# Patient Record
Sex: Male | Born: 1965
Health system: Southern US, Community
[De-identification: ages and names within clinical notes are randomized; demographics above are authoritative.]

## PROBLEM LIST (undated history)

## (undated) DIAGNOSIS — N200 Calculus of kidney: Secondary | ICD-10-CM

## (undated) DIAGNOSIS — E785 Hyperlipidemia, unspecified: Secondary | ICD-10-CM

## (undated) HISTORY — DX: Hyperlipidemia, unspecified: E78.5

## (undated) HISTORY — PX: FINGER SURGERY: SHX640

---

## 1997-11-28 ENCOUNTER — Encounter: Payer: Self-pay | Admitting: Emergency Medicine

## 1997-11-28 ENCOUNTER — Emergency Department (HOSPITAL_COMMUNITY): Admission: EM | Admit: 1997-11-28 | Discharge: 1997-11-28 | Payer: Self-pay | Admitting: Emergency Medicine

## 2000-09-04 ENCOUNTER — Ambulatory Visit (HOSPITAL_BASED_OUTPATIENT_CLINIC_OR_DEPARTMENT_OTHER): Admission: RE | Admit: 2000-09-04 | Discharge: 2000-09-04 | Payer: Self-pay | Admitting: *Deleted

## 2013-08-17 ENCOUNTER — Encounter: Payer: Self-pay | Admitting: Internal Medicine

## 2013-08-17 ENCOUNTER — Ambulatory Visit: Payer: Self-pay | Admitting: Internal Medicine

## 2013-08-17 ENCOUNTER — Ambulatory Visit (INDEPENDENT_AMBULATORY_CARE_PROVIDER_SITE_OTHER)
Admission: RE | Admit: 2013-08-17 | Discharge: 2013-08-17 | Disposition: A | Discharge status: Home / Self Care | Payer: BC Managed Care – PPO | Source: Ambulatory Visit | Attending: Internal Medicine | Admitting: Internal Medicine

## 2013-08-17 ENCOUNTER — Ambulatory Visit (INDEPENDENT_AMBULATORY_CARE_PROVIDER_SITE_OTHER): Payer: BC Managed Care – PPO | Admitting: Internal Medicine

## 2013-08-17 VITALS — BP 104/80 | HR 63 | Temp 98.2°F | Ht 72.0 in | Wt 197.8 lb

## 2013-08-17 DIAGNOSIS — R059 Cough, unspecified: Secondary | ICD-10-CM

## 2013-08-17 DIAGNOSIS — R05 Cough: Secondary | ICD-10-CM

## 2013-08-17 DIAGNOSIS — R058 Other specified cough: Secondary | ICD-10-CM

## 2013-08-17 MED ORDER — FAMOTIDINE 20 MG PO TABS: ORAL_TABLET | ORAL | Status: DC

## 2013-08-17 MED ORDER — PANTOPRAZOLE SODIUM 40 MG PO TBEC: 40.0000 mg | DELAYED_RELEASE_TABLET | Freq: Every day | ORAL | Status: DC

## 2013-08-17 MED ORDER — TRAMADOL HCL 50 MG PO TABS: ORAL_TABLET | ORAL | Status: DC

## 2013-08-17 MED ORDER — PREDNISONE 10 MG PO TABS: ORAL_TABLET | ORAL | Status: DC

## 2013-08-17 NOTE — Progress Notes (Signed)
Quick Note:  Spoke with pt and notified of results per Dr. Wert. Pt verbalized understanding and denied any questions.  ______ 

## 2013-08-17 NOTE — Patient Instructions (Signed)
The key to effective treatment for your cough is eliminating the non-stop cycle of cough you're stuck in long enough to let your airway heal completely and then see if there is anything still making you cough once you stop the cough suppression, but this should take no more than 5 days to figure out  First take delsym two tsp every 12 hours and supplement if needed with  tramadol 50 mg up to 2 every 4 hours to suppress the urge to cough at all or even clear your throat. Swallowing water or using ice chips/non mint and menthol containing candies (such as lifesavers or sugarless jolly ranchers) are also effective.  You should rest your voice and avoid activities that you know make you cough.  Once you have eliminated the cough for 3 straight days try reducing the tramadol first,  then the delsym as tolerated.    Prednisone 10 mg take  4 each am x 2 days,   2 each am x 2 days,  1 each am x 2 days and stop (this is to eliminate allergies and inflammation from coughing)  Protonix (pantoprazole) Take 30-60 min before first meal of the day and Pepcid 20 mg one bedtime plus  For drainage take chlortrimeton (chlorpheniramine) 4 mg every 4 hours available over the counter (may cause drowsiness)   GERD (REFLUX)  is an extremely common cause of respiratory symptoms, many times with no significant heartburn at all.    It can be treated with medication, but also with lifestyle changes including avoidance of late meals, excessive alcohol, smoking cessation, and avoid fatty foods, chocolate, peppermint, colas, red wine, and acidic juices such as orange juice.  NO MINT OR MENTHOL PRODUCTS SO NO COUGH DROPS  USE HARD CANDY INSTEAD (jolley ranchers or Stover's or Lifesavers (all available in sugarless versions) NO OIL BASED VITAMINS - use powdered substitutes.  Please schedule a follow up office visit in 2 weeks, sooner if needed

## 2013-08-17 NOTE — Assessment & Plan Note (Addendum)
The most common causes of chronic cough in immunocompetent adults include the following: upper airway cough syndrome (UACS), previously referred to as postnasal drip syndrome (PNDS), which is caused by variety of rhinosinus conditions; (2) asthma; (3) GERD; (4) chronic bronchitis from cigarette smoking or other inhaled environmental irritants; (5) nonasthmatic eosinophilic bronchitis; and (6) bronchiectasis.   These conditions, singly or in combination, have accounted for up to 94% of the causes of chronic cough in prospective studies.   Other conditions have constituted no >6% of the causes in prospective studies These have included bronchogenic carcinoma, chronic interstitial pneumonia, sarcoidosis, left ventricular failure, ACEI-induced cough, and aspiration from a condition associated with pharyngeal dysfunction.    Chronic cough is often simultaneously caused by more than one condition. A single cause has been found from 38 to 82% of the time, multiple causes from 18 to 62%. Multiply caused cough has been the result of three diseases up to 42% of the time.       Based on hx and exam, this is most likely:  Classic Upper airway cough syndrome, so named because it's frequently impossible to sort out how much is  CR/sinusitis with freq throat clearing (which can be related to primary GERD)   vs  causing  secondary (" extra esophageal")  GERD from wide swings in gastric pressure that occur with throat clearing, often  promoting self use of mint and menthol lozenges that reduce the lower esophageal sphincter tone and exacerbate the problem further in a cyclical fashion.   These are the same pts (now being labeled as having "irritable larynx syndrome" by some cough centers) who not infrequently have a history of having failed to tolerate ace inhibitors,  dry powder inhalers or biphosphonates or report having atypical reflux symptoms that don't respond to standard doses of PPI , and are easily confused as  having aecopd or asthma flares by even experienced allergists/ pulmonologists.   The first step is to maximize acid suppression and eliminate cyclical coughing then regroup in 2 weeks   See instructions for specific recommendations which were reviewed directly with the patient who was given a copy with highlighter outlining the key components.     

## 2013-08-17 NOTE — Progress Notes (Signed)
Subjective:    Patient ID: Dennis Murray, male    DOB: 1965/07/30   MRN: [id number]  HPI  41 yowm quit smoking 2011 with chronic cough dating back to at least 2007 referred by Dr Foy Guadalajara to pulmonary clinic for eval of incessant cough 08/17/2013    08/17/2013 1st [facility] office visit/ Sebastyan Snodgrass  Chief Complaint  Patient presents with  . Pulmonary Consult    Referred per Dr. Marinda Elk. Pt c/o cough and throat clearing for approx 8 yrs- worse for the past 2 yrs.  Cough is mainly non prod.  He has also noticed minimal DOE with walking up stairs occ.    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                            With trouble breathing? no   Do you have choking episodes when you cannot  Get enough air, gasping for air ?              Rarely    Do you usually cough when you lie down into  The bed, or when you just lie down to rest ?                          No , sleeps fine   Do you usually cough after meals or eating?         no   Do you cough when (or after) you bend over?    No    GERD SCORE     Kouffman Reflux v Neurogenic Cough Differentiator Neurogenic   Do you more-or-less cough all day long? yes   Does change of temperature make you cough? no   Does laughing or chuckling cause you to cough? yes   Do fumes (perfume, automobile fumes, burned  Toast, etc.,) cause you to cough ?      sometimes   Does speaking, singing, or talking on the phone cause you to cough   ?               yes   Neurogenic/Airway score      No obvious other patterns in day to day or daytime variabilty or assoc resting/noct sob or pleuritic/ ex cp or chest tightness, subjective wheeze overt sinus or hb symptoms. No unusual exp hx or h/o childhood pna/ asthma or knowledge of  premature birth.  Sleeping ok without nocturnal  or early am exacerbation  of respiratory  c/o's or need for noct saba. Also denies any obvious fluctuation of symptoms with weather or environmental changes or other aggravating or alleviating factors except as outlined above   Current Medications, Allergies, Complete Past Medical History, Past Surgical History, Family History, and Social History were reviewed in [employer] record.          Review of Systems  Constitutional: Negative for fever, chills, activity change, appetite change and unexpected weight change.  HENT: Positive for ear pain and trouble swallowing. Negative for congestion, dental problem, postnasal drip, rhinorrhea, sneezing, sore throat and voice change.   Eyes: Negative for visual disturbance.  Respiratory: Positive for cough and shortness of breath.  Negative for choking.   Cardiovascular: Negative for chest pain and leg swelling.  Gastrointestinal: Negative for nausea, vomiting and abdominal pain.  Genitourinary: Negative for difficulty urinating.  Musculoskeletal: Negative for arthralgias.  Skin: Negative for rash.  Psychiatric/Behavioral: Negative for behavioral problems and confusion.       Objective:   Physical Exam  amb wm with freq throat clearing  Wt Readings from Last 3 Encounters:  08/17/13 197 lb 12.8 oz (89.721 kg)      HEENT: nl dentition, turbinates, and orophanx. Nl external ear canals without cough reflex   NECK :  without JVD/Nodes/TM/ nl carotid upstrokes bilaterally   LUNGS: no acc muscle use, clear to A and P bilaterally without cough on insp or exp maneuvers   CV:  RRR  no s3 or murmur or increase in P2, no edema   ABD:  soft and nontender with nl excursion in the supine position. No bruits or organomegaly, bowel sounds nl  MS:  warm without deformities, calf tenderness, cyanosis or clubbing  SKIN: warm and dry without lesions    NEURO:  alert, approp, no  deficits     CXR  08/17/2013 :  The lungs are hyperinflated likely secondary to COPD. The heart size and mediastinal contours are within normal limits. Both lungs are clear. The visualized skeletal structures are unremarkable.      Assessment & Plan:

## 2013-08-21 ENCOUNTER — Telehealth: Payer: Self-pay | Admitting: Internal Medicine

## 2013-08-21 MED ORDER — TRAMADOL HCL 50 MG PO TABS: ORAL_TABLET | ORAL | Status: DC

## 2013-08-21 NOTE — Telephone Encounter (Signed)
Called and spoke to pt. Pt last seen on 08/17/13 by MW for upper airway cough syndrome. Pt stated he is taking the medication as directed and it has significantly decreased his cough but it has not yet resolved. Pt states he is almost out of the tramadol and wanted a refill if MW wanted him to continue taking it.   MW please advise if to refill tramadol.  No Known Allergies

## 2013-08-21 NOTE — Telephone Encounter (Signed)
Ok x one then [employer]

## 2013-08-21 NOTE — Telephone Encounter (Signed)
Rx called in. Pt has appt for 09/01/2013. Pt aware of appt and rx. Nothing further needed.

## 2013-09-01 ENCOUNTER — Ambulatory Visit (INDEPENDENT_AMBULATORY_CARE_PROVIDER_SITE_OTHER): Payer: BC Managed Care – PPO | Admitting: Internal Medicine

## 2013-09-01 ENCOUNTER — Encounter: Payer: Self-pay | Admitting: Internal Medicine

## 2013-09-01 ENCOUNTER — Ambulatory Visit: Payer: BC Managed Care – PPO | Admitting: Internal Medicine

## 2013-09-01 VITALS — BP 118/78 | HR 72 | Temp 98.5°F | Ht 72.0 in | Wt 191.4 lb

## 2013-09-01 DIAGNOSIS — R059 Cough, unspecified: Secondary | ICD-10-CM

## 2013-09-01 DIAGNOSIS — R058 Other specified cough: Secondary | ICD-10-CM

## 2013-09-01 DIAGNOSIS — R05 Cough: Secondary | ICD-10-CM

## 2013-09-01 MED ORDER — BENZONATATE 200 MG PO CAPS: ORAL_CAPSULE | ORAL | Status: DC

## 2013-09-01 NOTE — Progress Notes (Signed)
Subjective:    Patient ID: Dennis PeltonJoel Murray, male    DOB: 11/23/1965   MRN: [id number]    Brief patient profile:  48 yowm quit smoking 2011 with chronic cough dating back to at least 2007 referred by Dr Foy GuadalajaraFried to pulmonary clinic for eval of incessant cough 08/17/2013    History of Present Illness  08/17/2013 1st [facility] visit/ Dennis Murray  Chief Complaint  Patient presents with  . Pulmonary Consult    Referred per Dr. Marinda Elkobert Fried. Pt c/o cough and throat clearing for approx 8 yrs- worse for the past 2 yrs.  Cough is mainly non prod.  He has also noticed minimal DOE with walking up stairs occ.    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                            With trouble breathing? no   Do you have choking episodes when you cannot  Get enough air, gasping for air ?              Rarely    Do you usually cough when you lie down into  The bed, or when you just lie down to rest ?                          No , sleeps fine   Do you usually cough after meals or eating?         no   Do you cough when (or after) you bend over?    No    GERD SCORE     Kouffman Reflux v Neurogenic Cough Differentiator Neurogenic   Do you more-or-less cough all day long? yes   Does change of temperature make you cough? no   Does laughing or chuckling cause you to cough? yes   Do fumes (perfume, automobile fumes, burned  Toast, etc.,) cause you to cough ?      sometimes   Does speaking, singing, or talking on the phone cause you to cough   ?               yes   Neurogenic/Airway score          09/01/2013 f/u ov/Dennis Murray re: chronic cough 75% and no longer using tramadol  Chief Complaint  Patient presents with  . Follow-up    Pt states his cough has improved but never resolved. When  pt ran out of the tramadol his cough seemed to worsen again but has gotten better overall.     Not limited by breathing from desired activities    No obvious day to day or daytime variabilty or assoc excess/ purulent sputum production or cp or chest tightness, subjective wheeze overt sinus or hb symptoms. No unusual exp hx or h/o childhood pna/ asthma or knowledge of premature birth.  Sleeping ok without nocturnal  or early am exacerbation  of respiratory  c/o's or need for noct saba. Also denies any obvious fluctuation of symptoms with weather or environmental changes or other aggravating or alleviating factors except as outlined above   Current Medications, Allergies, Complete Past Medical History, Past Surgical History, Family History, and Social History  were reviewed in [facility] electronic medical record.  ROS  The following are not active complaints unless bolded sore throat, dysphagia, dental problems, itching, sneezing,  nasal congestion or excess/ purulent secretions, ear ache,   fever, chills, sweats, unintended wt loss, pleuritic or exertional cp, hemoptysis,  orthopnea pnd or leg swelling, presyncope, palpitations, heartburn, abdominal pain, anorexia, nausea, vomiting, diarrhea  or change in bowel or urinary habits, change in stools or urine, dysuria,hematuria,  rash, arthralgias, visual complaints, headache, numbness weakness or ataxia or problems with walking or coordination,  change in mood/affect or memory.           Objective:   Physical Exam  amb wm with no longer  throat clearing  . Wt Readings from Last 3 Encounters:  09/01/13 191 lb 6.4 oz (86.818 kg)  08/17/13 197 lb 12.8 oz (89.721 kg)         HEENT: nl dentition, turbinates, and orophanx. Nl external ear canals without cough reflex   NECK :  without JVD/Nodes/TM/ nl carotid upstrokes bilaterally   LUNGS: no acc muscle use, clear to A and P bilaterally without cough on insp or exp maneuvers   CV:  RRR   no s3 or murmur or increase in P2, no edema   ABD:  soft and nontender with nl excursion in the supine position. No bruits or organomegaly, bowel sounds nl  MS:  warm without deformities, calf tenderness, cyanosis or clubbing  SKIN: warm and dry without lesions    NEURO:  alert, approp, no deficits     CXR  08/17/2013 :  The lungs are hyperinflated likely secondary to COPD. The heart size and mediastinal contours are within normal limits. Both lungs are clear. The visualized skeletal structures are unremarkable.      Assessment & Plan:

## 2013-09-01 NOTE — Assessment & Plan Note (Signed)
Classic Upper airway cough syndrome, so named because it's frequently impossible to sort out how much is  CR/sinusitis with freq throat clearing (which can be related to primary GERD)   vs  causing  secondary (" extra esophageal")  GERD from wide swings in gastric pressure that occur with throat clearing, often  promoting self use of mint and menthol lozenges that reduce the lower esophageal sphincter tone and exacerbate the problem further in a cyclical fashion.   These are the same pts (now being labeled as having "irritable larynx syndrome" by some cough centers) who not infrequently have a history of having failed to tolerate ace inhibitors,  dry powder inhalers or biphosphonates or report having atypical reflux symptoms that don't respond to standard doses of PPI , and are easily confused as having aecopd or asthma flares by even experienced allergists/ pulmonologists.   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

## 2013-09-01 NOTE — Patient Instructions (Addendum)
Tessalon 200 mg every 4 hours as needed for tickle/ cough   For any sense of drainage take chlortrimeton (chlorpheniramine) 4 mg every 4 hours available over the counter (may cause drowsiness)   Stay on acid suppression x 6 wk then ok to drop off the pm dose protonix  If not satisfied the next step is adding neurontin 100 mg three times   Pulmonary follow up is as needed

## 2013-09-02 ENCOUNTER — Other Ambulatory Visit: Payer: Self-pay | Admitting: Internal Medicine

## 2016-11-29 DIAGNOSIS — N200 Calculus of kidney: Secondary | ICD-10-CM

## 2016-11-29 HISTORY — DX: Calculus of kidney: N20.0

## 2017-01-24 ENCOUNTER — Emergency Department (HOSPITAL_BASED_OUTPATIENT_CLINIC_OR_DEPARTMENT_OTHER): Payer: BLUE CROSS/BLUE SHIELD

## 2017-01-24 ENCOUNTER — Encounter (HOSPITAL_BASED_OUTPATIENT_CLINIC_OR_DEPARTMENT_OTHER): Payer: Self-pay | Admitting: *Deleted

## 2017-01-24 ENCOUNTER — Emergency Department (HOSPITAL_BASED_OUTPATIENT_CLINIC_OR_DEPARTMENT_OTHER)
Admission: EM | Admit: 2017-01-24 | Discharge: 2017-01-24 | Disposition: A | Discharge status: Home / Self Care | Payer: BLUE CROSS/BLUE SHIELD | Attending: Emergency Medicine | Admitting: Emergency Medicine

## 2017-01-24 ENCOUNTER — Other Ambulatory Visit: Payer: Self-pay

## 2017-01-24 DIAGNOSIS — M549 Dorsalgia, unspecified: Secondary | ICD-10-CM | POA: Diagnosis present

## 2017-01-24 DIAGNOSIS — N201 Calculus of ureter: Secondary | ICD-10-CM | POA: Insufficient documentation

## 2017-01-24 DIAGNOSIS — F17210 Nicotine dependence, cigarettes, uncomplicated: Secondary | ICD-10-CM | POA: Diagnosis not present

## 2017-01-24 HISTORY — DX: Calculus of kidney: N20.0

## 2017-01-24 LAB — CBC WITH DIFFERENTIAL/PLATELET
Basophils Absolute: 0 10*3/uL (ref 0.0–0.1)
Basophils Relative: 0 %
Eosinophils Absolute: 0 10*3/uL (ref 0.0–0.7)
Eosinophils Relative: 1 %
HCT: 43.4 % (ref 39.0–52.0)
Hemoglobin: 14.6 g/dL (ref 13.0–17.0)
Lymphocytes Relative: 24 %
Lymphs Abs: 2 10*3/uL (ref 0.7–4.0)
MCH: 28.7 pg (ref 26.0–34.0)
MCHC: 33.6 g/dL (ref 30.0–36.0)
MCV: 85.4 fL (ref 78.0–100.0)
Monocytes Absolute: 0.7 10*3/uL (ref 0.1–1.0)
Monocytes Relative: 8 %
Neutro Abs: 5.3 10*3/uL (ref 1.7–7.7)
Neutrophils Relative %: 67 %
Platelets: 232 10*3/uL (ref 150–400)
RBC: 5.08 MIL/uL (ref 4.22–5.81)
RDW: 13.3 % (ref 11.5–15.5)
WBC: 8 10*3/uL (ref 4.0–10.5)

## 2017-01-24 LAB — BASIC METABOLIC PANEL
Anion gap: 10 (ref 5–15)
BUN: 20 mg/dL (ref 6–20)
CO2: 24 mmol/L (ref 22–32)
Calcium: 8.7 mg/dL — ABNORMAL LOW (ref 8.9–10.3)
Chloride: 106 mmol/L (ref 101–111)
Creatinine, Ser: 1.14 mg/dL (ref 0.61–1.24)
GFR calc Af Amer: >60 mL/min (ref >60)
GFR calc non Af Amer: >60 mL/min (ref >60)
Glucose, Bld: 139 mg/dL — ABNORMAL HIGH (ref 65–99)
Potassium: 3.7 mmol/L (ref 3.5–5.1)
Sodium: 140 mmol/L (ref 135–145)

## 2017-01-24 LAB — URINALYSIS, ROUTINE W REFLEX MICROSCOPIC
Bilirubin Urine: NEGATIVE
Glucose, UA: NEGATIVE mg/dL
Ketones, ur: NEGATIVE mg/dL
Nitrite: NEGATIVE
Protein, ur: NEGATIVE mg/dL
Specific Gravity, Urine: >1.03 — ABNORMAL HIGH (ref 1.005–1.030)
pH: 6 (ref 5.0–8.0)

## 2017-01-24 LAB — URINALYSIS, MICROSCOPIC (REFLEX)

## 2017-01-24 MED ORDER — ONDANSETRON HCL 4 MG PO TABS: 4.0000 mg | ORAL_TABLET | Freq: Four times a day (QID) | ORAL | 0 refills | Status: DC

## 2017-01-24 MED ORDER — SODIUM CHLORIDE 0.9 % IV BOLUS (SEPSIS)
1000.0000 mL | Freq: Once | INTRAVENOUS | Status: AC
  Administered 2017-01-24: 1000 mL via INTRAVENOUS

## 2017-01-24 MED ORDER — OXYCODONE-ACETAMINOPHEN 5-325 MG PO TABS: 1.0000 | ORAL_TABLET | ORAL | 0 refills | Status: DC | PRN

## 2017-01-24 MED ORDER — HYDROMORPHONE HCL 1 MG/ML IJ SOLN
1.0000 mg | Freq: Once | INTRAMUSCULAR | Status: AC
  Administered 2017-01-24: 1 mg via INTRAVENOUS
  Filled 2017-01-24: qty 1

## 2017-01-24 MED ORDER — ONDANSETRON HCL 4 MG/2ML IJ SOLN
4.0000 mg | Freq: Once | INTRAMUSCULAR | Status: AC
  Administered 2017-01-24: 4 mg via INTRAVENOUS
  Filled 2017-01-24: qty 2

## 2017-01-24 MED ORDER — KETOROLAC TROMETHAMINE 15 MG/ML IJ SOLN
15.0000 mg | Freq: Once | INTRAMUSCULAR | Status: AC
  Administered 2017-01-24: 15 mg via INTRAVENOUS
  Filled 2017-01-24: qty 1

## 2017-01-24 MED FILL — OXYCODONE-ACETAMINOPHEN 5-3: 5-325 | 1 days supply | Qty: 12 | Fill #0

## 2017-01-24 MED FILL — ONDANSETRON HCL 4 MG TABLET: 4 | 3 days supply | Qty: 10 | Fill #0

## 2017-01-24 NOTE — ED Triage Notes (Signed)
Pt said he had symptoms of kidney stones. They were confirmed on Thanksgiving. Was treated for as UTI and symptoms went away. Symptoms came back this morning very severe R side flank pain and nausea. Pt took generic oxycodone for relief, but no relief yet.

## 2017-01-29 NOTE — ED Provider Notes (Signed)
[facility] EMERGENCY DEPARTMENT Provider Note   CSN: [id number] Arrival date & time: 01/24/17  4098     History   Chief Complaint Chief Complaint  Patient presents with  . Nausea  . Flank Pain    HPI Dennis Murray is a 52 y.o. male.  HPI   52 year old male with right flank and back pain.  Few days ago patient had mild pain in his right thoracic back.  He initially felt that he may have just slept awkwardly.  This pain resolved.  His morning he woke again with the severe pain in his flank.  It has been persistent since onset.  No appreciable exacerbating relieving factors.  No fevers or chills.  No urinary complaints.  Denies any past abdominal surgery.  Past Medical History:  Diagnosis Date  . Hyperlipidemia   . Kidney stones   . Kidney stones   . Kidney stones 11/2016    Patient Active Problem List   Diagnosis Date Noted  . Upper airway cough syndrome 08/17/2013    Past Surgical History:  Procedure Laterality Date  . FINGER SURGERY         Home Medications    Prior to Admission medications   Medication Sig Start Date End Date Taking? Authorizing Provider  benzonatate (TESSALON) 200 MG capsule One four times daily as needed for cough 09/01/13   Nyoka Cowden, MD  famotidine (PEPCID) 20 MG tablet One at bedtime 08/17/13   Nyoka Cowden, MD  ondansetron (ZOFRAN) 4 MG tablet Take 1 tablet (4 mg total) by mouth every 6 (six) hours. 01/24/17   Raeford Razor, MD  oxyCODONE-acetaminophen (PERCOCET/ROXICET) 5-325 MG tablet Take 1-2 tablets by mouth every 4 (four) hours as needed for severe pain. 01/24/17   Raeford Razor, MD  pantoprazole (PROTONIX) 40 MG tablet Take 1 tablet (40 mg total) by mouth daily. Take 30-60 min before first meal of the day 08/17/13   Nyoka Cowden, MD  traMADol (ULTRAM) 50 MG tablet TAKE 1 OR 2 TABLETS EVERY 4 HOURS AS NEEDED FOR COUGH/PAIN 09/02/13   Nyoka Cowden, MD    Family History Family History  Problem Relation Age of  Onset  . Allergies Sister   . Allergies Sister     Social History Social History   Tobacco Use  . Smoking status: Former Smoker    Packs/day: 0.50    Years: 25.00    Pack years: 12.50    Types: Cigarettes    Last attempt to quit: 01/30/2007    Years since quitting: 10.0  . Smokeless tobacco: Never Used  Substance Use Topics  . Alcohol use: Yes    Alcohol/week: 1.2 oz    Types: 2 Cans of beer per week  . Drug use: No     Allergies   Patient has no known allergies.   Review of Systems Review of Systems  All systems reviewed and negative, other than as noted in HPI.  Physical Exam Updated Vital Signs BP 124/86 (BP Location: Right Arm)   Pulse (!) 59   Temp (!) 97.5 F (36.4 C) (Oral)   Resp 18   Ht 6\' 1"  (1.854 m)   Wt 91.6 kg (202 lb)   SpO2 100%   BMI 26.65 kg/m   Physical Exam  Constitutional: He appears well-developed and well-nourished.  Appears uncomfortable, but nontoxic  HENT:  Head: Normocephalic and atraumatic.  Eyes: Conjunctivae are normal. Right eye exhibits no discharge. Left eye exhibits no discharge.  Neck:  Neck supple.  Cardiovascular: Normal rate, regular rhythm and normal heart sounds. Exam reveals no gallop and no friction rub.  No murmur heard. Pulmonary/Chest: Effort normal and breath sounds normal. No respiratory distress.  Abdominal: Soft. He exhibits no distension. There is no tenderness.  Musculoskeletal: He exhibits no edema or tenderness.  Neurological: He is alert.  Skin: Skin is warm and dry.  Psychiatric: He has a normal mood and affect. His behavior is normal. Thought content normal.  Nursing note and vitals reviewed.    ED Treatments / Results  Labs (all labs ordered are listed, but only abnormal results are displayed) Labs Reviewed  BASIC METABOLIC PANEL - Abnormal; Notable for the following components:      Result Value   Glucose, Bld 139 (*)    Calcium 8.7 (*)    All other components within normal limits    URINALYSIS, ROUTINE W REFLEX MICROSCOPIC - Abnormal; Notable for the following components:   Specific Gravity, Urine >1.030 (*)    Hgb urine dipstick LARGE (*)    Leukocytes, UA TRACE (*)    All other components within normal limits  URINALYSIS, MICROSCOPIC (REFLEX) - Abnormal; Notable for the following components:   Bacteria, UA FEW (*)    Squamous Epithelial / LPF 0-5 (*)    All other components within normal limits  CBC WITH DIFFERENTIAL/PLATELET    EKG  EKG Interpretation None       Radiology No results found.   Ct Renal Stone Study  Result Date: 01/24/2017 CLINICAL DATA:  Urinary tract infection. RIGHT-sided flank pain and nausea. EXAM: CT ABDOMEN AND PELVIS WITHOUT CONTRAST TECHNIQUE: Multidetector CT imaging of the abdomen and pelvis was performed following the standard protocol without IV contrast. COMPARISON:  None. FINDINGS: Lower chest: Lung bases are clear. Hepatobiliary: No focal hepatic lesion. No biliary duct dilatation. Gallbladder is normal. Common bile duct is normal. Pancreas: Pancreas is normal. No ductal dilatation. No pancreatic inflammation. Spleen: Normal spleen Adrenals/urinary tract: Adrenal glands normal. Mild pelvicaliectasis on the RIGHT. Mild hydroureter on the RIGHT. There is mild stranding along the course of RIGHT ureter. There is a partially obstructing calculus in the distal RIGHT ureter measuring 3 mm (image 80, series 2). This distal RIGHT ureteral calculus is approximately 1 cm from the vascular junction. No nephrolithiasis. No bladder calculi. Stomach/Bowel: Stomach, small bowel, appendix, and cecum are normal. The colon and rectosigmoid colon are normal. Vascular/Lymphatic: Abdominal aorta is normal caliber. There is no retroperitoneal or periportal lymphadenopathy. No pelvic lymphadenopathy. Reproductive: Prostate normal Other: No free fluid. Musculoskeletal: No aggressive osseous lesion. IMPRESSION: 1. Partially obstructing calculus in the distal  RIGHT ureter 1 cm from the vesicoureteral junction. 2. No nephrolithiasis. Electronically Signed   By: Genevive BiStewart  Edmunds M.D.   On: 01/24/2017 10:11    Procedures Procedures (including critical care time)  Medications Ordered in ED Medications  HYDROmorphone (DILAUDID) injection 1 mg (1 mg Intravenous Given 01/24/17 0942)  ketorolac (TORADOL) 15 MG/ML injection 15 mg (15 mg Intravenous Given 01/24/17 0942)  sodium chloride 0.9 % bolus 1,000 mL (0 mLs Intravenous Stopped 01/24/17 1112)  ondansetron (ZOFRAN) injection 4 mg (4 mg Intravenous Given 01/24/17 0942)     Initial Impression / Assessment and Plan / ED Course  I have reviewed the triage vital signs and the nursing notes.  Pertinent labs & imaging results that were available during my care of the patient were reviewed by me and considered in my medical decision making (see chart for details).  52 year old male with right flank pain.  CT significant for distal right ureteral stone.  Symptoms now controlled.  He is afebrile.  UA ok. Plan continue symptomatic treatment and expectant management.  Return precautions were discussed.  Urology follow-up as needed otherwise.  Final Clinical Impressions(s) / ED Diagnoses   Final diagnoses:  Ureteral stone    ED Discharge Orders        Ordered    oxyCODONE-acetaminophen (PERCOCET/ROXICET) 5-325 MG tablet  Every 4 hours PRN     01/24/17 1102    ondansetron (ZOFRAN) 4 MG tablet  Every 6 hours     01/24/17 1102       Raeford Razor, MD 01/29/17 1709

## 2018-08-25 ENCOUNTER — Emergency Department (HOSPITAL_COMMUNITY)
Admission: EM | Admit: 2018-08-25 | Discharge: 2018-08-25 | Discharge status: Absent without leave | Payer: BLUE CROSS/BLUE SHIELD

## 2019-08-29 IMAGING — CT CT RENAL STONE PROTOCOL
2 of 4 series · 16 of 46 positions shown, 18 images · non-contrast
Comparison: None.

CLINICAL DATA: Urinary tract infection. RIGHT-sided flank pain and
nausea.

EXAM:
CT ABDOMEN AND PELVIS WITHOUT CONTRAST
TECHNIQUE: Multidetector CT imaging of the abdomen and pelvis was performed
following the standard protocol without IV contrast.

[Series 2: axial st · axial · 0.86mm/px · z∈[-561,-106]mm · 13 of 101 slices shown, 15 images]
[im 5/101  soft-tissue]
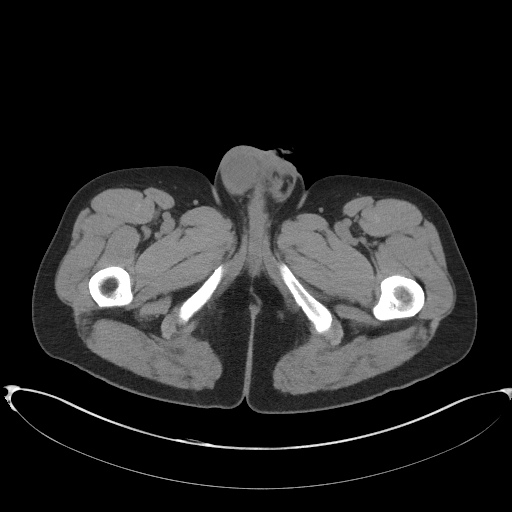
[im 5/101  bone]
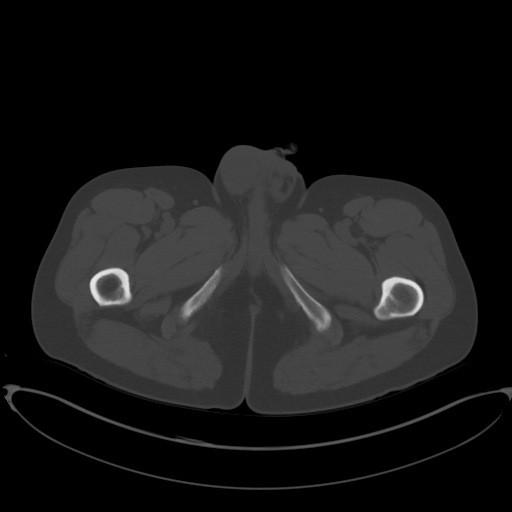
[im 13/101  soft-tissue]
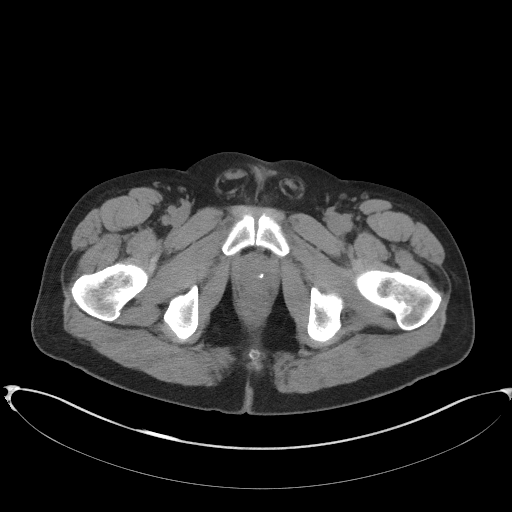
[im 21/101  soft-tissue]
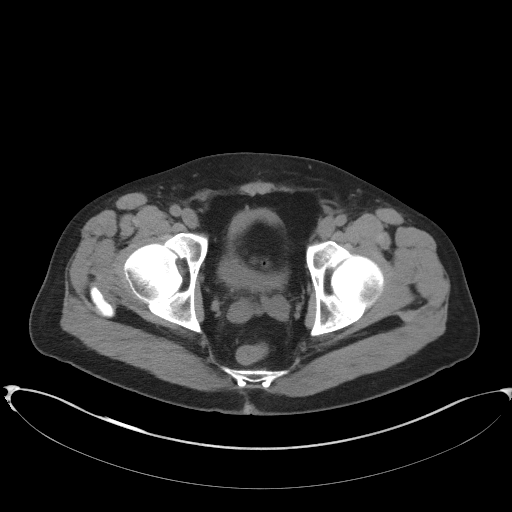
[im 30/101  soft-tissue]
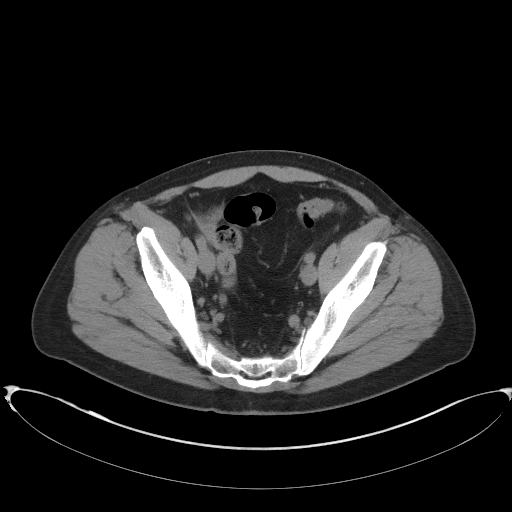
[im 34/101  soft-tissue]
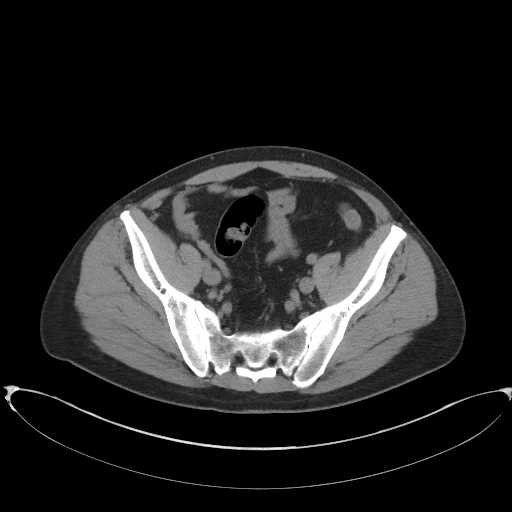
[im 42/101  soft-tissue]
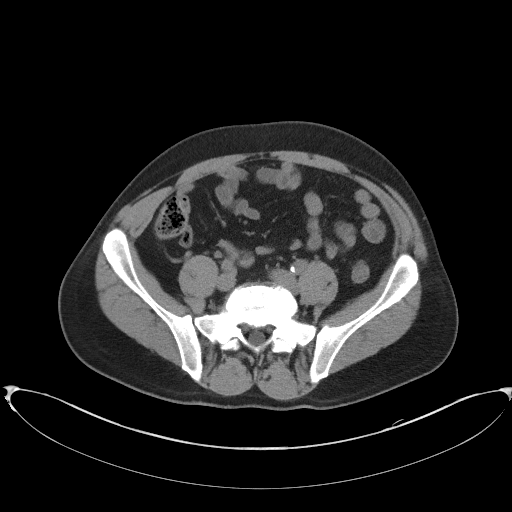
[im 51/101  soft-tissue]
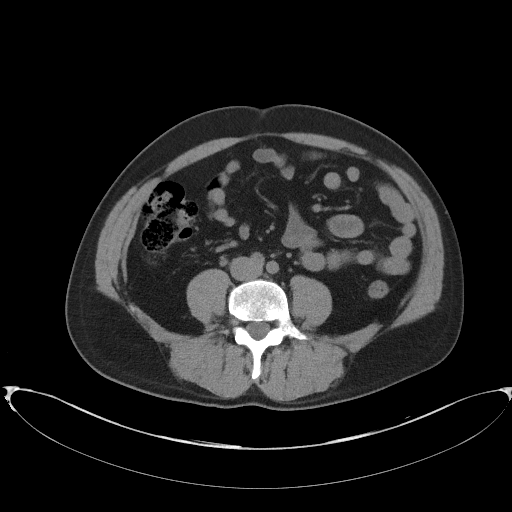
[im 59/101  soft-tissue]
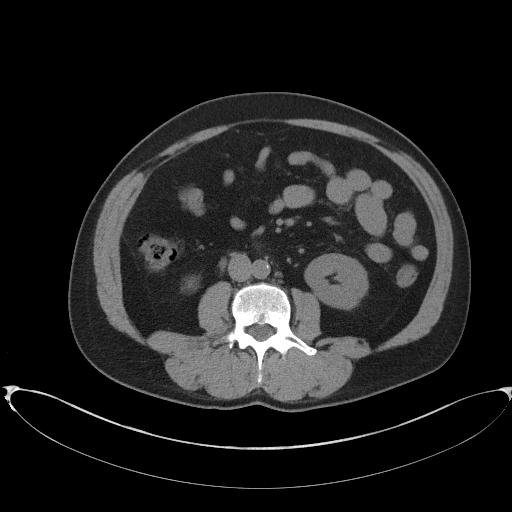
[im 67/101  soft-tissue]
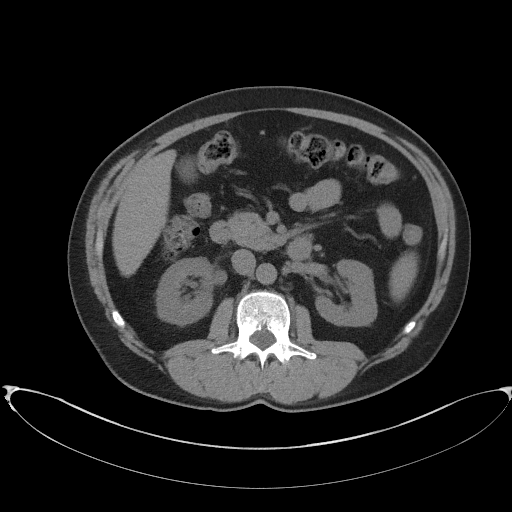
[im 67/101  bone]
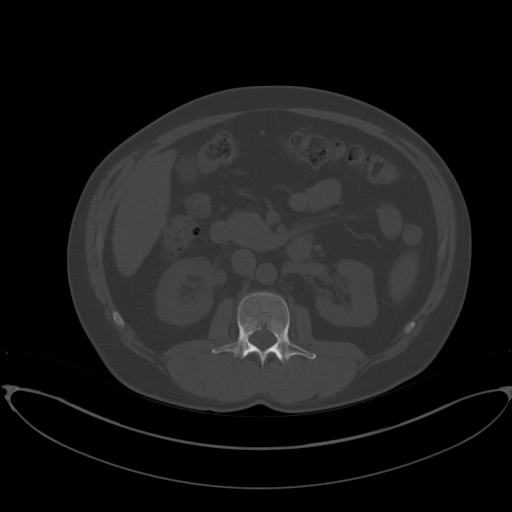
[im 71/101  soft-tissue]
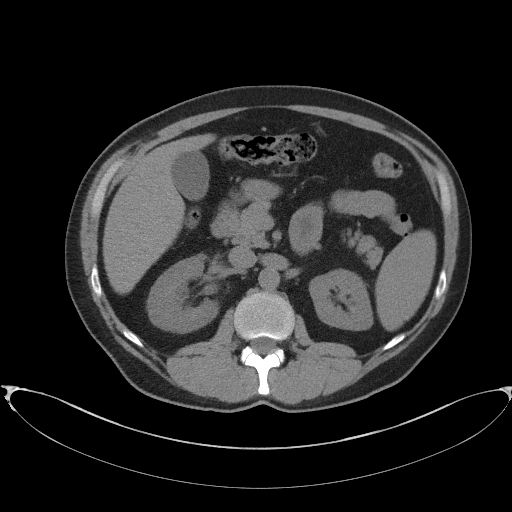
[im 80/101  soft-tissue]
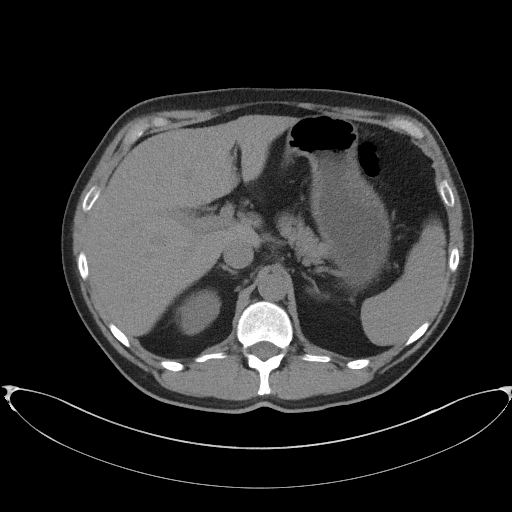
[im 88/101  soft-tissue]
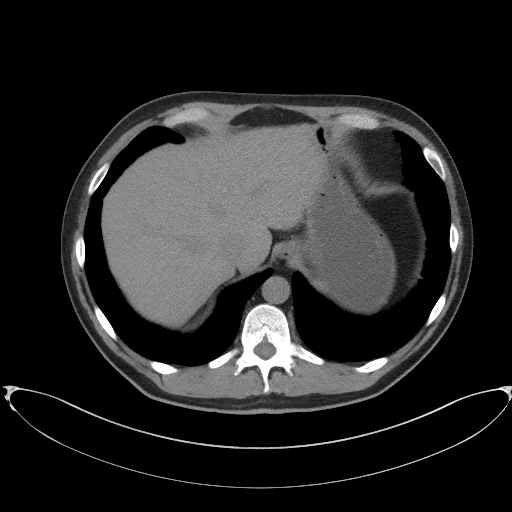
[im 96/101  soft-tissue]
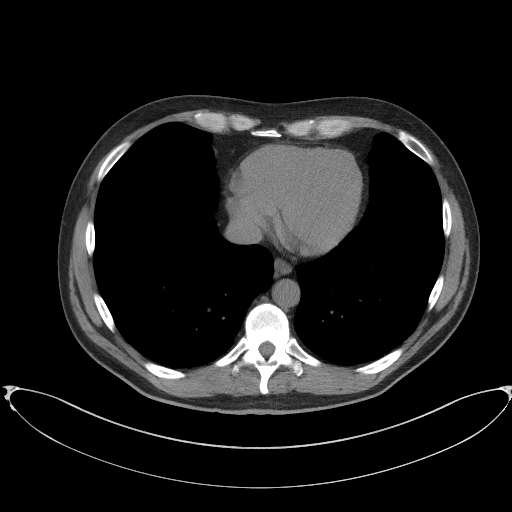

[Series 5: coronal st · coronal · 0.73mm/px · 3 of 96 slices shown]
[im 32/96  soft-tissue]
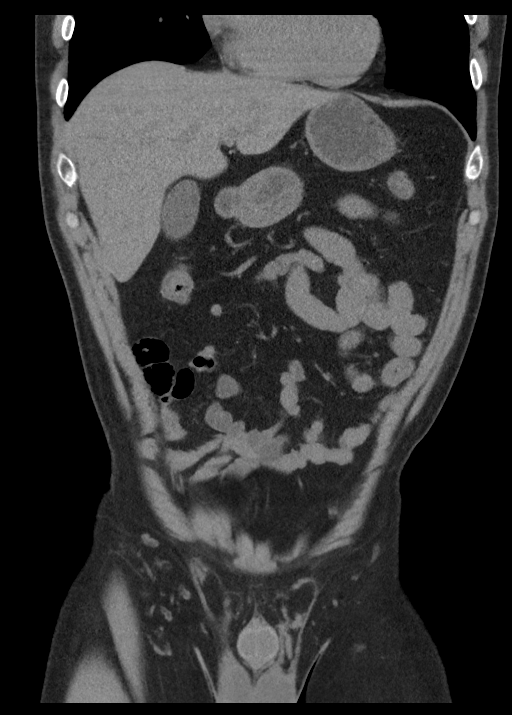
[im 43/96  soft-tissue]
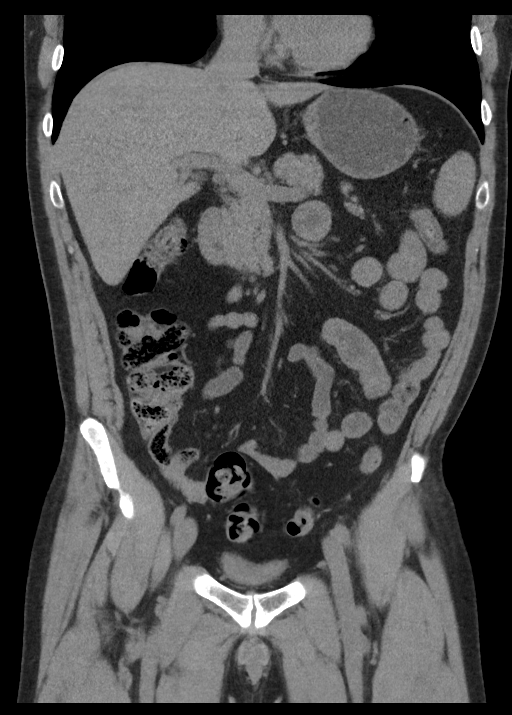
[im 53/96  soft-tissue]
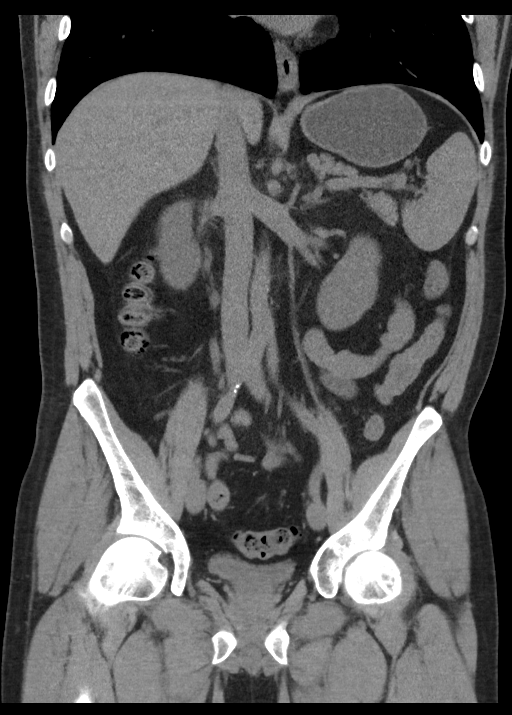

[16 of 46 positions shown; findings below may reference images not displayed]

FINDINGS: Lower chest: Lung bases are clear.

Hepatobiliary: No focal hepatic lesion. No biliary duct dilatation.
Gallbladder is normal. Common bile duct is normal.

Pancreas: Pancreas is normal. No ductal dilatation. No pancreatic
inflammation.

Spleen: Normal spleen

Adrenals/urinary tract: Adrenal glands normal. Mild pelvicaliectasis
on the RIGHT. Mild hydroureter on the RIGHT. There is mild stranding
along the course of RIGHT ureter. There is a partially obstructing
calculus in the distal RIGHT ureter measuring 3 mm (image 80, series
2). This distal RIGHT ureteral calculus is approximately 1 cm from
the vascular junction. No nephrolithiasis. No bladder calculi.

Stomach/Bowel: Stomach, small bowel, appendix, and cecum are normal.
The colon and rectosigmoid colon are normal.

Vascular/Lymphatic: Abdominal aorta is normal caliber. There is no
retroperitoneal or periportal lymphadenopathy. No pelvic
lymphadenopathy.

Reproductive: Prostate normal

Other: No free fluid.

Musculoskeletal: No aggressive osseous lesion.
IMPRESSION: 1. Partially obstructing calculus in the distal RIGHT ureter 1 cm
from the vesicoureteral junction.
2. No nephrolithiasis.

## 2021-02-17 ENCOUNTER — Ambulatory Visit (INDEPENDENT_AMBULATORY_CARE_PROVIDER_SITE_OTHER): Payer: Managed Care, Other (non HMO) | Admitting: Medical

## 2021-02-17 ENCOUNTER — Encounter: Payer: Self-pay | Admitting: Medical

## 2021-02-17 VITALS — BP 100/74 | HR 86 | Temp 98.4°F | Resp 18 | Ht 73.0 in | Wt 196.0 lb

## 2021-02-17 DIAGNOSIS — Z0000 Encounter for general adult medical examination without abnormal findings: Secondary | ICD-10-CM

## 2021-02-17 DIAGNOSIS — Z1283 Encounter for screening for malignant neoplasm of skin: Secondary | ICD-10-CM

## 2021-02-17 DIAGNOSIS — E785 Hyperlipidemia, unspecified: Secondary | ICD-10-CM | POA: Diagnosis not present

## 2021-02-17 DIAGNOSIS — Z125 Encounter for screening for malignant neoplasm of prostate: Secondary | ICD-10-CM

## 2021-02-17 LAB — CBC WITH DIFFERENTIAL/PLATELET
Basophils Absolute: 0 10*3/uL (ref 0.0–0.1)
Basophils Relative: 0.6 % (ref 0.0–3.0)
Eosinophils Absolute: 0 10*3/uL (ref 0.0–0.7)
Eosinophils Relative: 1 % (ref 0.0–5.0)
HCT: 46.4 % (ref 39.0–52.0)
Hemoglobin: 15.6 g/dL (ref 13.0–17.0)
Lymphocytes Relative: 25.5 % (ref 12.0–46.0)
Lymphs Abs: 1.3 10*3/uL (ref 0.7–4.0)
MCHC: 33.6 g/dL (ref 30.0–36.0)
MCV: 85.3 fl (ref 78.0–100.0)
Monocytes Absolute: 0.5 10*3/uL (ref 0.1–1.0)
Monocytes Relative: 10.9 % (ref 3.0–12.0)
Neutro Abs: 3.1 10*3/uL (ref 1.4–7.7)
Neutrophils Relative %: 62 % (ref 43.0–77.0)
Platelets: 230 10*3/uL (ref 150.0–400.0)
RBC: 5.43 Mil/uL (ref 4.22–5.81)
RDW: 13.2 % (ref 11.5–15.5)
WBC: 5 10*3/uL (ref 4.0–10.5)

## 2021-02-17 LAB — COMPREHENSIVE METABOLIC PANEL
ALT: 19 U/L (ref 0–53)
AST: 16 U/L (ref 0–37)
Albumin: 4.1 g/dL (ref 3.5–5.2)
Alkaline Phosphatase: 58 U/L (ref 39–117)
BUN: 17 mg/dL (ref 6–23)
CO2: 28 mEq/L (ref 19–32)
Calcium: 8.9 mg/dL (ref 8.4–10.5)
Chloride: 104 mEq/L (ref 96–112)
Creatinine, Ser: 1.09 mg/dL (ref 0.40–1.50)
GFR: 75.13 mL/min (ref >60.00)
Glucose, Bld: 92 mg/dL (ref 70–99)
Potassium: 4.5 mEq/L (ref 3.5–5.1)
Sodium: 137 mEq/L (ref 135–145)
Total Bilirubin: 0.6 mg/dL (ref 0.2–1.2)
Total Protein: 6.2 g/dL (ref 6.0–8.3)

## 2021-02-17 LAB — LIPID PANEL
Cholesterol: 202 mg/dL — ABNORMAL HIGH (ref 0–200)
HDL: 37.7 mg/dL — ABNORMAL LOW (ref >39.00)
LDL Cholesterol: 133 mg/dL — ABNORMAL HIGH (ref 0–99)
NonHDL: 163.89
Total CHOL/HDL Ratio: 5
Triglycerides: 154 mg/dL — ABNORMAL HIGH (ref 0.0–149.0)
VLDL: 30.8 mg/dL (ref 0.0–40.0)

## 2021-02-17 LAB — PSA: PSA: 2.12 ng/mL (ref 0.10–4.00)

## 2021-02-17 NOTE — Patient Instructions (Addendum)
For you wellness exam today I have ordered cbc, cmp, psa and lipid panel.  Vaccine probably up to date as you have had yearly wellness exam except for last year. If you could investigate when had last tdap. If you don't find can get schedule in future office appt or nurse visit.  Recommend exercise and healthy diet.  We will let you know lab results as they come in.  Follow up date appointment will be determined after lab review.    For hx of random intermittent cough would first recommend trying to use combination of both flonase and xyzal as you report allergy history. If cough persists then would order chest xray. You report chest xray in remote past but since years ago will be difficult to track down.   Preventive Care 66-69 Years Old, Male Preventive care refers to lifestyle choices and visits with your health care provider that can promote health and wellness. Preventive care visits are also called wellness exams. What can I expect for my preventive care visit? Counseling During your preventive care visit, your health care provider may ask about your: Medical history, including: Past medical problems. Family medical history. Current health, including: Emotional well-being. Home life and relationship well-being. Sexual activity. Lifestyle, including: Alcohol, nicotine or tobacco, and drug use. Access to firearms. Diet, exercise, and sleep habits. Safety issues such as seatbelt and bike helmet use. Sunscreen use. Work and work Astronomer. Physical exam Your health care provider will check your: Height and weight. These may be used to calculate your BMI (body mass index). BMI is a measurement that tells if you are at a healthy weight. Waist circumference. This measures the distance around your waistline. This measurement also tells if you are at a healthy weight and may help predict your risk of certain diseases, such as type 2 diabetes and high blood pressure. Heart rate and  blood pressure. Body temperature. Skin for abnormal spots. What immunizations do I need? Vaccines are usually given at various ages, according to a schedule. Your health care provider will recommend vaccines for you based on your age, medical history, and lifestyle or other factors, such as travel or where you work. What tests do I need? Screening Your health care provider may recommend screening tests for certain conditions. This may include: Lipid and cholesterol levels. Diabetes screening. This is done by checking your blood sugar (glucose) after you have not eaten for a while (fasting). Hepatitis B test. Hepatitis C test. HIV (human immunodeficiency virus) test. STI (sexually transmitted infection) testing, if you are at risk. Lung cancer screening. Prostate cancer screening. Colorectal cancer screening. Talk with your health care provider about your test results, treatment options, and if necessary, the need for more tests. Follow these instructions at home: Eating and drinking  Eat a diet that includes fresh fruits and vegetables, whole grains, lean protein, and low-fat dairy products. Take vitamin and mineral supplements as recommended by your health care provider. Do not drink alcohol if your health care provider tells you not to drink. If you drink alcohol: Limit how much you have to 0-2 drinks a day. Know how much alcohol is in your drink. In the [place] one drink equals one 12 oz bottle of beer (355 mL), one 5 oz glass of wine (148 mL), or one 1 oz glass of hard liquor (44 mL). Lifestyle Brush your teeth every morning and night with fluoride toothpaste. Floss one time each day. Exercise for at least 30 minutes 5 or more days each  week. Do not use any products that contain nicotine or tobacco. These products include cigarettes, chewing tobacco, and vaping devices, such as e-cigarettes. If you need help quitting, ask your health care provider. Do not use drugs. If you are  sexually active, practice safe sex. Use a condom or other form of protection to prevent STIs. Take aspirin only as told by your health care provider. Make sure that you understand how much to take and what form to take. Work with your health care provider to find out whether it is safe and beneficial for you to take aspirin daily. Find healthy ways to manage stress, such as: Meditation, yoga, or listening to music. Journaling. Talking to a trusted person. Spending time with friends and family. Minimize exposure to UV radiation to reduce your risk of skin cancer. Safety Always wear your seat belt while driving or riding in a vehicle. Do not drive: If you have been drinking alcohol. Do not ride with someone who has been drinking. When you are tired or distracted. While texting. If you have been using any mind-altering substances or drugs. Wear a helmet and other protective equipment during sports activities. If you have firearms in your house, make sure you follow all gun safety procedures. What's next? Go to your health care provider once a year for an annual wellness visit. Ask your health care provider how often you should have your eyes and teeth checked. Stay up to date on all vaccines. This information is not intended to replace advice given to you by your health care provider. Make sure you discuss any questions you have with your health care provider. Document Revised: 07/13/2020 Document Reviewed: 07/13/2020 Elsevier Patient Education  2022 [employer].

## 2021-02-17 NOTE — Progress Notes (Signed)
Subjective:    Patient ID: Dennis Murray, male    DOB: September 18, 1965, 56 y.o.   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 Walks few thousand feet a day. Stats eats healthy. Rarely eats out. Nonsmoker. Stopped 13 years ago. 2 drinks alcohol a week. No sodas.  Pt states mild dry throat in morning. In morning faint transient st. Hx of allergies or related to heat in house running all the time.. No associated viral syndrome.  Up to date on colonoscopy.   Flu vaccine declined.  Pt declines covid vaccine. Had one time last summer.    Review of Systems  Constitutional:  Negative for chills, fatigue and fever.  HENT:  Negative for congestion, drooling, ear pain, postnasal drip, sinus pain and sneezing.        Mild dry throat.  Respiratory:  Positive for cough. Negative for chest tightness, shortness of breath and wheezing.        13 years on and off cough. 9 years ago he saw someone and tx for reflux and various other meds.   Cardiovascular:  Negative for chest pain and palpitations.  Gastrointestinal:  Negative for abdominal pain, constipation and diarrhea.  Genitourinary:  Negative for dysuria and frequency.  Musculoskeletal:  Negative for back pain, joint swelling and myalgias.  Skin:  Negative for rash.  Neurological:  Negative for dizziness, numbness and headaches.  Hematological:  Negative for adenopathy. Does not bruise/bleed easily.  Psychiatric/Behavioral:  Negative for behavioral problems, confusion and sleep disturbance. The patient is not nervous/anxious.     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   Tobacco Use   Smoking status: Former    Packs/day: 0.50    Years: 25.00    Pack years: 12.50    Types: Cigarettes    Quit date: 01/30/2007    Years since quitting: 14.0   Smokeless tobacco: Never  Substance and Sexual Activity   Alcohol use: Yes    Alcohol/week: 2.0 standard drinks    Types: 2 Cans of beer per week   Drug use: No   Sexual activity: Not on file  Other Topics Concern   Not on file  Social History Narrative   Not on file   Social Determinants of Health   Financial Resource Strain: Not on file  Food Insecurity: Not on file  Transportation Needs: Not on file  Physical Activity: Not on file  Stress: Not on file  Social Connections: Not on file  Intimate Partner Violence: Not on file    Past Surgical History:  Procedure Laterality Date   FINGER SURGERY      Family History  Problem Relation Age of Onset   Allergies Sister    Allergies Sister     No Known Allergies  Current Outpatient Medications on File Prior to Visit  Medication Sig Dispense Refill   cholecalciferol (VITAMIN D3) 25 MCG (1000 UNIT) tablet Take 1,000 Units by mouth daily.     No current facility-administered medications  on file prior to visit.    BP 100/74    Pulse 86    Temp 98.4 F (36.9 C)    Resp 18    Ht 6\' 1"  (1.854 m)    Wt 196 lb (88.9 kg)    SpO2 94%    BMI 25.86 kg/m         Objective:   Physical Exam  General Mental Status- Alert. General Appearance- Not in acute distress.   Skin General: Color- Normal Color. Moisture- Normal Moisture.  Neck Carotid Arteries- Normal color. Moisture- Normal Moisture. No carotid bruits. No JVD.  Chest and Lung Exam Auscultation: Breath Sounds:-Normal.  Cardiovascular Auscultation:Rythm- Regular. Murmurs & Other Heart Sounds:Auscultation of the heart reveals- No Murmurs.  Abdomen Inspection:-Inspeection Normal. Palpation/Percussion:Note:No mass. Palpation and Percussion of the abdomen reveal-  Non Tender, Non Distended + BS, no rebound or guarding.   Neurologic Cranial Nerve exam:- CN III-XII intact(No nystagmus), symmetric smile. Strength:- 5/5 equal and symmetric strength both upper and lower extremities.   Derm- scattered freckles and sporadic scattere small to medium moles on back.    Assessment & Plan:   Patient Instructions  For you wellness exam today I have ordered cbc, cmp, psa and lipid panel.  Vaccine probably up to date as you have had yearly wellness exam except for last year. If you could investigate when had last tdap. If you don't find can get schedule in future office appt or nurse visit.  Recommend exercise and healthy diet.  We will let you know lab results as they come in.  Follow up date appointment will be determined after lab review.    For hx of random intermittent cough would first recommend tyring to use combination of both flonase and xyzal as you report allergy history. If cough persists then would order chest xray. You report chest xray in remote past but since years ago will be difficult to track down.   , PA-C

## 2022-09-10 DIAGNOSIS — J189 Pneumonia, unspecified organism: Secondary | ICD-10-CM | POA: Diagnosis not present

## 2022-09-10 DIAGNOSIS — Z20822 Contact with and (suspected) exposure to covid-19: Secondary | ICD-10-CM | POA: Diagnosis not present

## 2022-09-10 DIAGNOSIS — R059 Cough, unspecified: Secondary | ICD-10-CM | POA: Diagnosis not present

## 2022-10-03 ENCOUNTER — Encounter: Payer: Self-pay | Admitting: Medical

## 2022-10-03 ENCOUNTER — Ambulatory Visit: Payer: BC Managed Care – PPO | Admitting: Medical

## 2022-10-03 VITALS — BP 107/71 | HR 82 | Resp 19 | Ht 73.0 in | Wt 194.0 lb

## 2022-10-03 DIAGNOSIS — Z0000 Encounter for general adult medical examination without abnormal findings: Secondary | ICD-10-CM

## 2022-10-03 DIAGNOSIS — R0600 Dyspnea, unspecified: Secondary | ICD-10-CM | POA: Diagnosis not present

## 2022-10-03 DIAGNOSIS — E785 Hyperlipidemia, unspecified: Secondary | ICD-10-CM | POA: Diagnosis not present

## 2022-10-03 DIAGNOSIS — K219 Gastro-esophageal reflux disease without esophagitis: Secondary | ICD-10-CM

## 2022-10-03 DIAGNOSIS — R059 Cough, unspecified: Secondary | ICD-10-CM

## 2022-10-03 DIAGNOSIS — Z125 Encounter for screening for malignant neoplasm of prostate: Secondary | ICD-10-CM

## 2022-10-03 DIAGNOSIS — Z0001 Encounter for general adult medical examination with abnormal findings: Secondary | ICD-10-CM

## 2022-10-03 DIAGNOSIS — R739 Hyperglycemia, unspecified: Secondary | ICD-10-CM

## 2022-10-03 MED ORDER — FAMOTIDINE 20 MG PO TABS: 20.0000 mg | ORAL_TABLET | Freq: Every day | ORAL | 0 refills | Status: DC

## 2022-10-03 NOTE — Progress Notes (Signed)
Subjective:    Patient ID: Dennis Murray, male    DOB: January 17, 1966, 57 y.o.   MRN: [id number]  HPI  Pt in for wellness exam.  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 Pt walks some at work but a lot worse than previous job. Stats eats healthy. Rarely eats out. Nonsmoker. Stopped 15 years ago. 2 drinks alcohol a week. No sodas.    Pt still has dry cough as he mentioned last wellness exam.   "Pt states mild dry throat in morning. In morning faint transient st. Hx of allergies or related to heat in house running all the time. No associated viral syndrome."  Pt also states he has extra concerns and sensitive to signs/symptoms had some cardiac like events. One friend had cardiac event after ablation procedure. Other friend older had severe CAD.  So pt is concerned and he described one event after climbing flight of stairs in his house bending over to tie shoes and felt dyspnea for few minutes. Pt had no chest pain.  On routine basis can walk his dogs mile with no issues.   Pt was remote smoker of less than a pack a day 15 years ago. He smoked 17-44 years ago.  Pt has high cholesterol. No family hx of heart attack or stroke in parents or siblings.    The 10-year ASCVD risk score (Arnett DK, et al., 2019) is: 7.3%   Values used to calculate the score:     Age: 40 years     Sex: Male     Is Non-Hispanic African American: No     Diabetic: No     Tobacco smoker: No     Systolic Blood Pressure: 107 mmHg     Is BP treated: No     HDL Cholesterol: 37.7 mg/dL     Total Cholesterol: 202 mg/dL     Review of Systems  Constitutional:  Negative for chills, fatigue and fever.  Respiratory:  Negative for cough, chest tightness, shortness of breath and wheezing.        2 month ago. Brief aypical dyspnea event.  Cardiovascular:  Negative for chest pain and palpitations.  Genitourinary:  Negative for dysuria, flank pain and hematuria.  Musculoskeletal:  Negative for arthralgias, gait problem and neck pain.  Skin:   Negative for rash.  Neurological:  Negative for dizziness, seizures, syncope, weakness and headaches.  Hematological:  Negative for adenopathy. Does not bruise/bleed easily.  Psychiatric/Behavioral:  Negative for behavioral problems and decreased concentration. The patient is not nervous/anxious.      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   Tobacco Use   Smoking status: Former    Current packs/day: 0.00    Average packs/day: 0.5 packs/day for 25.0 years (12.5 ttl pk-yrs)    Types: Cigarettes    Start date: 01/29/1982    Quit date: 01/30/2007    Years since quitting: 15.6   Smokeless tobacco: Never  Vaping Use   Vaping status: Never Used  Substance and Sexual Activity   Alcohol use: Yes    Alcohol/week: 2.0  standard drinks of alcohol    Types: 2 Cans of beer per week   Drug use: No   Sexual activity: Yes  Other Topics Concern   Not on file  Social History Narrative   Not on file   Social Determinants of Health   Financial Resource Strain: Not on file  Food Insecurity: Not on file  Transportation Needs: Not on file  Physical Activity: Not on file  Stress: Not on file  Social Connections: Not on file  Intimate Partner Violence: Not on file    Past Surgical History:  Procedure Laterality Date   FINGER SURGERY      Family History  Problem Relation Age of Onset   Allergies Sister    Allergies Sister     No Known Allergies  Current Outpatient Medications on File Prior to Visit  Medication Sig Dispense Refill   cholecalciferol (VITAMIN D3) 25 MCG (1000 UNIT) tablet Take 1,000 Units by mouth daily.     No current  facility-administered medications on file prior to visit.    BP 107/71 (BP Location: Left Arm, Patient Position: Sitting, Cuff Size: Normal)   Pulse 82   Resp 19   Ht 6\' 1"  (1.854 m)   Wt 194 lb (88 kg)   SpO2 98%   BMI 25.60 kg/m        Objective:   Physical Exam  General Mental Status- Alert. General Appearance- Not in acute distress.   Skin Small scattered freckles and tiny moles on back and abdomen/upper chest. No worrisome lesions.  Neck Carotid Arteries- Normal color. Moisture- Normal Moisture. No carotid bruits. No JVD.  Chest and Lung Exam Auscultation: Breath Sounds:-Normal.  Cardiovascular Auscultation:Rythm- Regular. Murmurs & Other Heart Sounds:Auscultation of the heart reveals- No Murmurs.  Abdomen Inspection:-Inspeection Normal. Palpation/Percussion:Note:No mass. Palpation and Percussion of the abdomen reveal- Non Tender, Non Distended + BS, no rebound or guarding.   Neurologic Cranial Nerve exam:- CN III-XII intact(No nystagmus), symmetric smile. Strength:- 5/5 equal and symmetric strength both upper and lower extremities.       Assessment & Plan:   Patient Instructions  For you wellness exam today I have ordered cbc, cmp and lipid panel.  Vaccine given today tdap. Educated on shingrix and flu vaccine. Can get those later since defered today  Up to date on colonoscopy. Told to repeat in 10 years. He was passed 48 when study was done. If you can get [place] that report so we can scan.  Recommend exercise and healthy diet.  We will let you know lab results as they come in.  Follow up date appointment will be determined after lab review.     2. Hyperlipidemia, unspecified hyperlipidemia type Your one event of shortness of breath one month ago did not sound cardiac like. But will follow closely lipids and did EKG today. Discussed 10 year cardiovascular risk score. May go ahead and rx low dose statin. Or may refer to cardiologist after lab review. -  EKG 12-Lead(EKG shows normal sinus rhythm)  3. Screening for prostate cancer  - PSA; Future  4. Dyspnea, unspecified type One event short of breath months ago. On discussion seem to tolerate walking dogs well distance of a mile. Remote hx of smoking but stopped years ago. If any recurrent abnormal shortness of breath events occur let me know. Getting 02 at monitor over the counter and knowing pulse and 02 sat % at that time would be helpful - EKG 12-Lead  5. Elevated blood sugar Low sugar  diet recommended. - Hemoglobin A1c  6. Cough, unspecified type Recent pneumonia 2 weeks ago. Treated with antiobitc. Recommend repeat cxr in 1-2 weeks.  - DG Chest 2 View; Future  7. Gastroesophageal reflux disease(history with random cough). Also report hx of slight transient painful swallowing -rx famoatadine and eat bland diet.  - on follow up decide on referral to gastroenterologist.   Follow up in 3 weeks or sooner if needed.       [id number] charge in addition to wellness exam. Addreseed cough, hx of gerd, one event dypsnea and hyperlipidemia. Did EKG today as work up to clarify his concern for cardiovascular risk.

## 2022-10-03 NOTE — Patient Instructions (Addendum)
For you wellness exam today I have ordered cbc, cmp and lipid panel.  Vaccine given today tdap. Educated on shingrix and flu vaccine. Can get those later since defered today  Up to date on colonoscopy. Told to repeat in 10 years. He was passed 48 when study was done. If you can get [place] that report so we can scan.  Recommend exercise and healthy diet.  We will let you know lab results as they come in.  Follow up date appointment will be determined after lab review.     2. Hyperlipidemia, unspecified hyperlipidemia type Your one event of shortness of breath one month ago did not sound cardiac like. But will follow closely lipids and did EKG today. Discussed 10 year cardiovascular risk score. May go ahead and rx low dose statin. Or may refer to cardiologist after lab review. - EKG 12-Lead(EKG shows normal sinus rhythm)  3. Screening for prostate cancer  - PSA; Future  4. Dyspnea, unspecified type One event short of breath months ago. On discussion seem to tolerate walking dogs well distance of a mile. Remote hx of smoking but stopped years ago. If any recurrent abnormal shortness of breath events occur let me know. Getting 02 at monitor over the counter and knowing pulse and 02 sat % at that time would be helpful - EKG 12-Lead  5. Elevated blood sugar Low sugar diet recommended. - Hemoglobin A1c  6. Cough, unspecified type Recent pneumonia 2 weeks ago. Treated with antiobitc. Recommend repeat cxr in 1-2 weeks.  - DG Chest 2 View; Future  7. Gastroesophageal reflux disease(history with random cough). Also report hx of slight transient painful swallowing -rx famoatadine and eat bland diet.  - on follow up decide on referral to gastroenterologist.   Follow up in 3 weeks or sooner if needed.  Preventive Care 59-7 Years Old, Male Preventive care refers to lifestyle choices and visits with your health care provider that can promote health and wellness. Preventive care visits are also  called wellness exams. What can I expect for my preventive care visit? Counseling During your preventive care visit, your health care provider may ask about your: Medical history, including: Past medical problems. Family medical history. Current health, including: Emotional well-being. Home life and relationship well-being. Sexual activity. Lifestyle, including: Alcohol, nicotine or tobacco, and drug use. Access to firearms. Diet, exercise, and sleep habits. Safety issues such as seatbelt and bike helmet use. Sunscreen use. Work and work Astronomer. Physical exam Your health care provider will check your: Height and weight. These may be used to calculate your BMI (body mass index). BMI is a measurement that tells if you are at a healthy weight. Waist circumference. This measures the distance around your waistline. This measurement also tells if you are at a healthy weight and may help predict your risk of certain diseases, such as type 2 diabetes and high blood pressure. Heart rate and blood pressure. Body temperature. Skin for abnormal spots. What immunizations do I need?  Vaccines are usually given at various ages, according to a schedule. Your health care provider will recommend vaccines for you based on your age, medical history, and lifestyle or other factors, such as travel or where you work. What tests do I need? Screening Your health care provider may recommend screening tests for certain conditions. This may include: Lipid and cholesterol levels. Diabetes screening. This is done by checking your blood sugar (glucose) after you have not eaten for a while (fasting). Hepatitis B test. Hepatitis C  test. HIV (human immunodeficiency virus) test. STI (sexually transmitted infection) testing, if you are at risk. Lung cancer screening. Prostate cancer screening. Colorectal cancer screening. Talk with your health care provider about your test results, treatment options, and if  necessary, the need for more tests. Follow these instructions at home: Eating and drinking  Eat a diet that includes fresh fruits and vegetables, whole grains, lean protein, and low-fat dairy products. Take vitamin and mineral supplements as recommended by your health care provider. Do not drink alcohol if your health care provider tells you not to drink. If you drink alcohol: Limit how much you have to 0-2 drinks a day. Know how much alcohol is in your drink. In the [place] one drink equals one 12 oz bottle of beer (355 mL), one 5 oz glass of wine (148 mL), or one 1 oz glass of hard liquor (44 mL). Lifestyle Brush your teeth every morning and night with fluoride toothpaste. Floss one time each day. Exercise for at least 30 minutes 5 or more days each week. Do not use any products that contain nicotine or tobacco. These products include cigarettes, chewing tobacco, and vaping devices, such as e-cigarettes. If you need help quitting, ask your health care provider. Do not use drugs. If you are sexually active, practice safe sex. Use a condom or other form of protection to prevent STIs. Take aspirin only as told by your health care provider. Make sure that you understand how much to take and what form to take. Work with your health care provider to find out whether it is safe and beneficial for you to take aspirin daily. Find healthy ways to manage stress, such as: Meditation, yoga, or listening to music. Journaling. Talking to a trusted person. Spending time with friends and family. Minimize exposure to UV radiation to reduce your risk of skin cancer. Safety Always wear your seat belt while driving or riding in a vehicle. Do not drive: If you have been drinking alcohol. Do not ride with someone who has been drinking. When you are tired or distracted. While texting. If you have been using any mind-altering substances or drugs. Wear a helmet and other protective equipment during sports  activities. If you have firearms in your house, make sure you follow all gun safety procedures. What's next? Go to your health care provider once a year for an annual wellness visit. Ask your health care provider how often you should have your eyes and teeth checked. Stay up to date on all vaccines. This information is not intended to replace advice given to you by your health care provider. Make sure you discuss any questions you have with your health care provider. Document Revised: 07/13/2020 Document Reviewed: 07/13/2020 Elsevier Patient Education  2024 [employer].

## 2022-10-08 ENCOUNTER — Other Ambulatory Visit (INDEPENDENT_AMBULATORY_CARE_PROVIDER_SITE_OTHER): Payer: BC Managed Care – PPO

## 2022-10-08 DIAGNOSIS — Z125 Encounter for screening for malignant neoplasm of prostate: Secondary | ICD-10-CM | POA: Diagnosis not present

## 2022-10-08 DIAGNOSIS — Z0000 Encounter for general adult medical examination without abnormal findings: Secondary | ICD-10-CM | POA: Diagnosis not present

## 2022-10-08 LAB — CBC WITH DIFFERENTIAL/PLATELET
Basophils Absolute: 0 10*3/uL (ref 0.0–0.1)
Basophils Relative: 0.5 % (ref 0.0–3.0)
Eosinophils Absolute: 0.1 10*3/uL (ref 0.0–0.7)
Eosinophils Relative: 1.3 % (ref 0.0–5.0)
HCT: 44.9 % (ref 39.0–52.0)
Hemoglobin: 14.8 g/dL (ref 13.0–17.0)
Lymphocytes Relative: 29.6 % (ref 12.0–46.0)
Lymphs Abs: 1.6 10*3/uL (ref 0.7–4.0)
MCHC: 33 g/dL (ref 30.0–36.0)
MCV: 87.8 fl (ref 78.0–100.0)
Monocytes Absolute: 0.5 10*3/uL (ref 0.1–1.0)
Monocytes Relative: 9.2 % (ref 3.0–12.0)
Neutro Abs: 3.2 10*3/uL (ref 1.4–7.7)
Neutrophils Relative %: 59.4 % (ref 43.0–77.0)
Platelets: 226 10*3/uL (ref 150.0–400.0)
RBC: 5.12 Mil/uL (ref 4.22–5.81)
RDW: 13.6 % (ref 11.5–15.5)
WBC: 5.4 10*3/uL (ref 4.0–10.5)

## 2022-10-08 LAB — COMPREHENSIVE METABOLIC PANEL
ALT: 14 U/L (ref 0–53)
AST: 13 U/L (ref 0–37)
Albumin: 4.3 g/dL (ref 3.5–5.2)
Alkaline Phosphatase: 80 U/L (ref 39–117)
BUN: 20 mg/dL (ref 6–23)
CO2: 27 meq/L (ref 19–32)
Calcium: 9.1 mg/dL (ref 8.4–10.5)
Chloride: 107 meq/L (ref 96–112)
Creatinine, Ser: 1.06 mg/dL (ref 0.40–1.50)
GFR: 76.8 mL/min (ref >60.00)
Glucose, Bld: 95 mg/dL (ref 70–99)
Potassium: 3.8 meq/L (ref 3.5–5.1)
Sodium: 143 meq/L (ref 135–145)
Total Bilirubin: 0.6 mg/dL (ref 0.2–1.2)
Total Protein: 6.9 g/dL (ref 6.0–8.3)

## 2022-10-08 LAB — PSA: PSA: 1.41 ng/mL (ref 0.10–4.00)

## 2022-10-08 LAB — LIPID PANEL
Cholesterol: 189 mg/dL (ref 0–200)
HDL: 40 mg/dL (ref >39.00)
LDL Cholesterol: 122 mg/dL — ABNORMAL HIGH (ref 0–99)
NonHDL: 149.02
Total CHOL/HDL Ratio: 5
Triglycerides: 135 mg/dL (ref 0.0–149.0)
VLDL: 27 mg/dL (ref 0.0–40.0)

## 2022-10-29 ENCOUNTER — Other Ambulatory Visit: Payer: Self-pay | Admitting: Medical

## 2022-12-03 ENCOUNTER — Other Ambulatory Visit: Payer: Self-pay | Admitting: Medical

## 2022-12-05 ENCOUNTER — Telehealth: Payer: Self-pay | Admitting: Medical

## 2022-12-05 NOTE — Telephone Encounter (Signed)
Pt dropped off paperwork to be completed by PCP. Pls call pt when ready to be picked up. Placed in pcp tray in front office.

## 2022-12-06 NOTE — Telephone Encounter (Signed)
LMOM informing Pt that form has been completed and ready for pick up. Copy sent for scanning.

## 2023-01-01 ENCOUNTER — Other Ambulatory Visit: Payer: Self-pay | Admitting: Medical

## 2023-02-03 ENCOUNTER — Other Ambulatory Visit: Payer: Self-pay | Admitting: Medical

## 2023-02-27 ENCOUNTER — Ambulatory Visit: Payer: BC Managed Care – PPO | Admitting: Family Medicine

## 2023-02-27 ENCOUNTER — Ambulatory Visit: Payer: Self-pay | Admitting: Medical

## 2023-02-27 VITALS — BP 98/72 | HR 93 | Temp 100.4°F | Resp 18 | Ht 73.0 in | Wt 192.8 lb

## 2023-02-27 DIAGNOSIS — J101 Influenza due to other identified influenza virus with other respiratory manifestations: Secondary | ICD-10-CM

## 2023-02-27 DIAGNOSIS — R051 Acute cough: Secondary | ICD-10-CM

## 2023-02-27 LAB — POC COVID19 BINAXNOW: SARS Coronavirus 2 Ag: NEGATIVE

## 2023-02-27 LAB — POC INFLUENZA A&B (BINAX/QUICKVUE)
Influenza A, POC: POSITIVE — AB
Influenza B, POC: NEGATIVE

## 2023-02-27 MED ORDER — OSELTAMIVIR PHOSPHATE 75 MG PO CAPS: 75.0000 mg | ORAL_CAPSULE | Freq: Two times a day (BID) | ORAL | 0 refills | Status: AC

## 2023-02-27 MED ORDER — PROMETHAZINE-DM 6.25-15 MG/5ML PO SYRP: 5.0000 mL | ORAL_SOLUTION | Freq: Four times a day (QID) | ORAL | 0 refills | Status: DC | PRN

## 2023-02-27 NOTE — Telephone Encounter (Signed)
Copied from CRM [id number]. Topic: Clinical - Pink Word Triage >> Feb 27, 2023  9:08 AM Dennis Murray wrote: Reason for Triage: Pt wife Dennis Murray called and thinks the pt has walking phemunia. Symptoms are tight chest, coughing, fatigue, and a headach. Callback is [phone]  No triage needed as patient already had office visit and was diagnosed with Flu type A.  Prescriptions ordered.    Reason for Disposition  Caller has already spoken with the PCP and has no further questions.  Protocols used: No Contact or Duplicate Contact Call-A-AH

## 2023-02-27 NOTE — Progress Notes (Signed)
Chief Complaint  Patient presents with   Cough    Pt states having productive cough, headache, and fatigue. No covid test. No otc meds     Dennis Murray here for URI complaints.  Duration: 2 days  Associated symptoms: sinus headache, rhinorrhea, ear pain, wheezing, shortness of breath, myalgia, and productive cough Denies: itchy watery eyes, ear drainage, sore throat, and N/V/D Treatment to date: Tylenol and rest Sick contacts: No  Past Medical History:  Diagnosis Date   Hyperlipidemia    Kidney stones    Kidney stones    Kidney stones 11/2016    Objective BP 98/72 (BP Location: Left Arm, Patient Position: Sitting, Cuff Size: Normal)   Pulse 93   Temp (!) 100.4 F (38 C) (Oral)   Resp 18   Ht 6\' 1"  (1.854 m)   Wt 192 lb 12.8 oz (87.5 kg)   SpO2 95%   BMI 25.44 kg/m  General: Awake, alert, appears stated age HEENT: AT, [place], ears patent b/l and TM's neg, nares patent w/o discharge, pharynx pink and without exudates, MMM Neck: No masses or asymmetry Heart: RRR Lungs: CTAB, no accessory muscle use Psych: Age appropriate judgment and insight, normal mood and affect  Influenza A - Plan: POC COVID-19, POC Influenza A&B (Binax test), oseltamivir (TAMIFLU) 75 MG capsule, promethazine-dextromethorphan (PROMETHAZINE-DM) 6.25-15 MG/5ML syrup  Acute cough  Acute illness w fever/systemic s/s's. +for Flu A. Tamiflu, cough syrup as above. Warnings about drowsiness verbalized. Continue to push fluids, practice good hand hygiene, cover mouth when coughing. F/u prn. If starting to experience worsening s/s's, shaking, or shortness of breath, seek immediate care. Pt voiced understanding and agreement to the plan.  Jilda Roche [place], DO 02/27/23 1:41 PM

## 2023-02-27 NOTE — Patient Instructions (Addendum)
Continue to push fluids, practice good hand hygiene, and cover your mouth if you cough.  If you start having fevers, shaking or shortness of breath, seek immediate care.  OK to take Tylenol 1000 mg (2 extra strength tabs) or 975 mg (3 regular strength tabs) every 6 hours as needed.  Ibuprofen 400-600 mg (2-3 over the counter strength tabs) every 6 hours as needed for pain.  Do not drink alcohol, do any illicit/street drugs, drive or do anything that requires alertness while on this cough medicine.   Let us know if you need anything.

## 2023-02-28 ENCOUNTER — Telehealth: Payer: Self-pay | Admitting: Medical

## 2023-02-28 MED ORDER — HYDROCODONE BIT-HOMATROP MBR 5-1.5 MG/5ML PO SOLN: 5.0000 mL | Freq: Three times a day (TID) | ORAL | 0 refills | Status: DC | PRN

## 2023-02-28 NOTE — Telephone Encounter (Signed)
Copied from CRM [id number]. Topic: Clinical - Medication Question >> Feb 28, 2023  9:47 AM Orinda Kenner C wrote: Reason for CRM: Patient [phone] states promethazine-dextromethorphan (PROMETHAZINE-DM) 6.25-15 MG/5ML syrup is not helping and had  some Hydromet 5mg /mL syrup take 5ml every 4-6 hours as needed for cough left, which seems to work better. Can provider prescribed this instead and sent to [facility] [id number] - [place], [place] - [address] [place] [address] [phone]:[phone]

## 2023-02-28 NOTE — Telephone Encounter (Signed)
Sent!

## 2023-12-16 ENCOUNTER — Ambulatory Visit: Admitting: Medical

## 2023-12-16 VITALS — BP 110/72 | HR 68 | Temp 97.7°F | Resp 16 | Ht 73.0 in | Wt 197.6 lb

## 2023-12-16 DIAGNOSIS — R1310 Dysphagia, unspecified: Secondary | ICD-10-CM | POA: Diagnosis not present

## 2023-12-16 DIAGNOSIS — R739 Hyperglycemia, unspecified: Secondary | ICD-10-CM

## 2023-12-16 DIAGNOSIS — Z1211 Encounter for screening for malignant neoplasm of colon: Secondary | ICD-10-CM

## 2023-12-16 DIAGNOSIS — Z1283 Encounter for screening for malignant neoplasm of skin: Secondary | ICD-10-CM

## 2023-12-16 DIAGNOSIS — Z0000 Encounter for general adult medical examination without abnormal findings: Secondary | ICD-10-CM

## 2023-12-16 DIAGNOSIS — Z125 Encounter for screening for malignant neoplasm of prostate: Secondary | ICD-10-CM

## 2023-12-16 NOTE — Progress Notes (Signed)
 Subjective:    Patient ID: Dennis Murray, male    DOB: Jun 13, 1965, 58 y.o.   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 Stats eats healthy. Rarely eats out. Nonsmoker. Stopped 15 years ago. 2 drinks alcohol a week. Rare soda.   Pt declined flu vaccine. Pt declined  othrvaccines presently as well.  Pt had colonoscopy at age 30 years. States it is now due for repeat as last one was normal.       Review of Systems  Constitutional:  Negative for chills and fever.  HENT:  Negative for congestion.   Respiratory:  Negative for cough, chest tightness and wheezing.   Cardiovascular:  Negative for leg swelling.  Gastrointestinal:  Negative for abdominal pain, constipation and vomiting.       Dysphagia. Has had for years and pt thinks may need esphagus stretched though no choking episodes.  Genitourinary:  Negative for dysuria.  Musculoskeletal:  Negative for back pain, myalgias and neck stiffness.  Skin:  Negative for rash.  Neurological:  Negative for dizziness, weakness and numbness.  Hematological:  Negative for adenopathy. Does not bruise/bleed easily.  Psychiatric/Behavioral:  Negative for behavioral problems, decreased concentration and dysphoric mood.     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   Tobacco Use   Smoking status: Former    Current packs/day: 0.00    Average packs/day: 0.5 packs/day for 25.0 years (12.5 ttl pk-yrs)    Types: Cigarettes    Start date: 01/29/1982    Quit date: 01/30/2007    Years since quitting: 16.8   Smokeless tobacco: Never  Vaping Use   Vaping status:  Never Used  Substance and Sexual Activity   Alcohol use: Yes    Alcohol/week: 2.0 standard drinks of alcohol    Types: 2 Cans of beer per week   Drug use: No   Sexual activity: Yes  Other Topics Concern   Not on file  Social History Narrative   Not on file   Social Drivers of Health   Financial Resource Strain: Low Risk  (12/16/2023)   Overall Financial Resource Strain (CARDIA)    Difficulty of Paying Living Expenses: Not very hard  Food Insecurity: No Food Insecurity (12/16/2023)   Hunger Vital Sign    Worried About Running Out of Food in the Last Year: Never true    Ran Out of Food in the Last Year: Never true  Transportation Needs: No Transportation Needs (12/16/2023)   PRAPARE - Administrator, Civil Service (Medical): No    Lack of Transportation (Non-Medical): No  Physical Activity: Insufficiently Active (12/16/2023)   Exercise Vital Sign    Days of Exercise per Week: 2 days    Minutes of Exercise per Session: 20 min  Stress: No Stress Concern Present (12/16/2023)   [employer] of Occupational Health - Occupational Stress Questionnaire    Feeling of Stress: Only a little  Social Connections: Moderately Isolated (12/16/2023)   Social Connection and Isolation Panel    Frequency  of Communication with Friends and Family: Three times a week    Frequency of Social Gatherings with Friends and Family: Once a week    Attends Religious Services: Patient declined    Database Administrator or Organizations: No    Attends Engineer, Structural: Not on file    Marital Status: Married  Catering Manager Violence: Not on file    Past Surgical History:  Procedure Laterality Date   FINGER SURGERY      Family History  Problem Relation Age of Onset   Allergies Sister    Allergies Sister     No Known Allergies  Current Outpatient Medications on File Prior to Visit  Medication Sig Dispense Refill   cholecalciferol (VITAMIN D3) 25 MCG (1000 UNIT)  tablet Take 1,000 Units by mouth daily. (Patient not taking: Reported on 12/16/2023)     famotidine  (PEPCID ) 20 MG tablet TAKE 1 TABLET BY MOUTH DAILY (Patient not taking: Reported on 12/16/2023) 30 tablet 0   No current facility-administered medications on file prior to visit.    BP 110/72   Pulse 68   Temp 97.7 F (36.5 C) (Oral)   Resp 16   Ht 6' 1 (1.854 m)   Wt 197 lb 9.6 oz (89.6 kg)   SpO2 97%   BMI 26.07 kg/m        Objective:   Physical Exam  General Mental Status- Alert. General Appearance- Not in acute distress.   Skin Scattered dark varying sized skin lesion moles on upper back and some on scalp  Neck Carotid Arteries- Normal color. Moisture- Normal Moisture. No carotid bruits. No JVD.  Chest and Lung Exam Auscultation: Breath Sounds:-Normal.  Cardiovascular Auscultation:Rythm- Regular. Murmurs & Other Heart Sounds:Auscultation of the heart reveals- No Murmurs.  Abdomen Inspection:-Inspeection Normal. Palpation/Percussion:Note:No mass. Palpation and Percussion of the abdomen reveal- Non Tender, Non Distended + BS, no rebound or guarding.    Neurologic Cranial Nerve exam:- CN III-XII intact(No nystagmus), symmetric smile. Drift Test:- No drift.. Finger to Nose:- Normal/Intact Strength:- 5/5 equal and symmetric strength both upper and lower extremities.       Assessment & Plan:   Patient Instructions  For you wellness exam today I have ordered cbc, cmp, psa and lipid panel.  Vaccine declined/deferred. Counseled on vaccines.  Recommend exercise and healthy diet.  We will let you know lab results as they come in.  Follow up date appointment will be determined after lab review.    Referred for colonoscopy and to get opinion on dysphagia.      Mayley Lish, PA-C

## 2023-12-16 NOTE — Patient Instructions (Addendum)
 For you wellness exam today I have ordered cbc, cmp, psa and lipid panel.  Vaccine declined/deferred. Counseled on vaccines.  Recommend exercise and healthy diet.  We will let you know lab results as they come in.  Follow up date appointment will be determined after lab review.    Referred for colonoscopy and to get opinion on dysphagia.  Also screening derm exam.   Preventive Care 56-60 Years Old, Male Preventive care refers to lifestyle choices and visits with your health care provider that can promote health and wellness. Preventive care visits are also called wellness exams. What can I expect for my preventive care visit? Counseling During your preventive care visit, your health care provider may ask about your: Medical history, including: Past medical problems. Family medical history. Current health, including: Emotional well-being. Home life and relationship well-being. Sexual activity. Lifestyle, including: Alcohol, nicotine or tobacco, and drug use. Access to firearms. Diet, exercise, and sleep habits. Safety issues such as seatbelt and bike helmet use. Sunscreen use. Work and work astronomer. Physical exam Your health care provider will check your: Height and weight. These may be used to calculate your BMI (body mass index). BMI is a measurement that tells if you are at a healthy weight. Waist circumference. This measures the distance around your waistline. This measurement also tells if you are at a healthy weight and may help predict your risk of certain diseases, such as type 2 diabetes and high blood pressure. Heart rate and blood pressure. Body temperature. Skin for abnormal spots. What immunizations do I need?  Vaccines are usually given at various ages, according to a schedule. Your health care provider will recommend vaccines for you based on your age, medical history, and lifestyle or other factors, such as travel or where you work. What tests do I  need? Screening Your health care provider may recommend screening tests for certain conditions. This may include: Lipid and cholesterol levels. Diabetes screening. This is done by checking your blood sugar (glucose) after you have not eaten for a while (fasting). Hepatitis B test. Hepatitis C test. HIV (human immunodeficiency virus) test. STI (sexually transmitted infection) testing, if you are at risk. Lung cancer screening. Prostate cancer screening. Colorectal cancer screening. Talk with your health care provider about your test results, treatment options, and if necessary, the need for more tests. Follow these instructions at home: Eating and drinking  Eat a diet that includes fresh fruits and vegetables, whole grains, lean protein, and low-fat dairy products. Take vitamin and mineral supplements as recommended by your health care provider. Do not drink alcohol if your health care provider tells you not to drink. If you drink alcohol: Limit how much you have to 0-2 drinks a day. Know how much alcohol is in your drink. In the [place] one drink equals one 12 oz bottle of beer (355 mL), one 5 oz glass of wine (148 mL), or one 1 oz glass of hard liquor (44 mL). Lifestyle Brush your teeth every morning and night with fluoride toothpaste. Floss one time each day. Exercise for at least 30 minutes 5 or more days each week. Do not use any products that contain nicotine or tobacco. These products include cigarettes, chewing tobacco, and vaping devices, such as e-cigarettes. If you need help quitting, ask your health care provider. Do not use drugs. If you are sexually active, practice safe sex. Use a condom or other form of protection to prevent STIs. Take aspirin only as told by your health care provider.  Make sure that you understand how much to take and what form to take. Work with your health care provider to find out whether it is safe and beneficial for you to take aspirin daily. Find  healthy ways to manage stress, such as: Meditation, yoga, or listening to music. Journaling. Talking to a trusted person. Spending time with friends and family. Minimize exposure to UV radiation to reduce your risk of skin cancer. Safety Always wear your seat belt while driving or riding in a vehicle. Do not drive: If you have been drinking alcohol. Do not ride with someone who has been drinking. When you are tired or distracted. While texting. If you have been using any mind-altering substances or drugs. Wear a helmet and other protective equipment during sports activities. If you have firearms in your house, make sure you follow all gun safety procedures. What's next? Go to your health care provider once a year for an annual wellness visit. Ask your health care provider how often you should have your eyes and teeth checked. Stay up to date on all vaccines. This information is not intended to replace advice given to you by your health care provider. Make sure you discuss any questions you have with your health care provider. Document Revised: 07/13/2020 Document Reviewed: 07/13/2020 Elsevier Patient Education  2024 [employer].

## 2023-12-18 ENCOUNTER — Other Ambulatory Visit

## 2023-12-18 ENCOUNTER — Ambulatory Visit: Payer: Self-pay | Admitting: Medical

## 2023-12-18 DIAGNOSIS — Z125 Encounter for screening for malignant neoplasm of prostate: Secondary | ICD-10-CM

## 2023-12-18 DIAGNOSIS — Z0000 Encounter for general adult medical examination without abnormal findings: Secondary | ICD-10-CM

## 2023-12-18 DIAGNOSIS — R739 Hyperglycemia, unspecified: Secondary | ICD-10-CM

## 2023-12-18 LAB — LIPID PANEL
Cholesterol: 206 mg/dL — ABNORMAL HIGH (ref 0–200)
HDL: 39.6 mg/dL (ref >39.00)
LDL Cholesterol: 139 mg/dL — ABNORMAL HIGH (ref 0–99)
NonHDL: 166.52
Total CHOL/HDL Ratio: 5
Triglycerides: 138 mg/dL (ref 0.0–149.0)
VLDL: 27.6 mg/dL (ref 0.0–40.0)

## 2023-12-18 LAB — HEMOGLOBIN A1C: Hgb A1c MFr Bld: 5.6 % (ref 4.6–6.5)

## 2023-12-18 LAB — COMPREHENSIVE METABOLIC PANEL WITH GFR
ALT: 22 U/L (ref 0–53)
AST: 18 U/L (ref 0–37)
Albumin: 4.3 g/dL (ref 3.5–5.2)
Alkaline Phosphatase: 66 U/L (ref 39–117)
BUN: 17 mg/dL (ref 6–23)
CO2: 28 meq/L (ref 19–32)
Calcium: 8.6 mg/dL (ref 8.4–10.5)
Chloride: 105 meq/L (ref 96–112)
Creatinine, Ser: 1.09 mg/dL (ref 0.40–1.50)
GFR: 73.65 mL/min (ref >60.00)
Glucose, Bld: 102 mg/dL — ABNORMAL HIGH (ref 70–99)
Potassium: 3.9 meq/L (ref 3.5–5.1)
Sodium: 139 meq/L (ref 135–145)
Total Bilirubin: 0.7 mg/dL (ref 0.2–1.2)
Total Protein: 6.6 g/dL (ref 6.0–8.3)

## 2023-12-18 LAB — CBC WITH DIFFERENTIAL/PLATELET
Basophils Absolute: 0 K/uL (ref 0.0–0.1)
Basophils Relative: 0.5 % (ref 0.0–3.0)
Eosinophils Absolute: 0.1 K/uL (ref 0.0–0.7)
Eosinophils Relative: 1 % (ref 0.0–5.0)
HCT: 43.9 % (ref 39.0–52.0)
Hemoglobin: 14.8 g/dL (ref 13.0–17.0)
Lymphocytes Relative: 27.4 % (ref 12.0–46.0)
Lymphs Abs: 1.3 K/uL (ref 0.7–4.0)
MCHC: 33.8 g/dL (ref 30.0–36.0)
MCV: 87.5 fl (ref 78.0–100.0)
Monocytes Absolute: 0.4 K/uL (ref 0.1–1.0)
Monocytes Relative: 7.8 % (ref 3.0–12.0)
Neutro Abs: 3.1 K/uL (ref 1.4–7.7)
Neutrophils Relative %: 63.3 % (ref 43.0–77.0)
Platelets: 219 K/uL (ref 150.0–400.0)
RBC: 5.01 Mil/uL (ref 4.22–5.81)
RDW: 13.2 % (ref 11.5–15.5)
WBC: 4.8 K/uL (ref 4.0–10.5)

## 2023-12-18 LAB — PSA: PSA: 1.41 ng/mL (ref 0.10–4.00)

## 2023-12-24 ENCOUNTER — Other Ambulatory Visit: Payer: Self-pay

## 2023-12-24 ENCOUNTER — Emergency Department (HOSPITAL_BASED_OUTPATIENT_CLINIC_OR_DEPARTMENT_OTHER)
Admission: EM | Admit: 2023-12-24 | Discharge: 2023-12-24 | Disposition: A | Discharge status: Home / Self Care | Attending: Emergency Medicine | Admitting: Emergency Medicine

## 2023-12-24 ENCOUNTER — Encounter (HOSPITAL_BASED_OUTPATIENT_CLINIC_OR_DEPARTMENT_OTHER): Payer: Self-pay

## 2023-12-24 ENCOUNTER — Ambulatory Visit: Payer: Self-pay

## 2023-12-24 ENCOUNTER — Emergency Department (HOSPITAL_BASED_OUTPATIENT_CLINIC_OR_DEPARTMENT_OTHER)

## 2023-12-24 DIAGNOSIS — K5730 Diverticulosis of large intestine without perforation or abscess without bleeding: Secondary | ICD-10-CM | POA: Diagnosis not present

## 2023-12-24 DIAGNOSIS — R10A2 Flank pain, left side: Secondary | ICD-10-CM

## 2023-12-24 DIAGNOSIS — N23 Unspecified renal colic: Secondary | ICD-10-CM | POA: Insufficient documentation

## 2023-12-24 DIAGNOSIS — N200 Calculus of kidney: Secondary | ICD-10-CM | POA: Diagnosis not present

## 2023-12-24 DIAGNOSIS — R109 Unspecified abdominal pain: Secondary | ICD-10-CM | POA: Diagnosis not present

## 2023-12-24 DIAGNOSIS — N132 Hydronephrosis with renal and ureteral calculous obstruction: Secondary | ICD-10-CM | POA: Diagnosis not present

## 2023-12-24 LAB — URINALYSIS, ROUTINE W REFLEX MICROSCOPIC
Bilirubin Urine: NEGATIVE
Glucose, UA: NEGATIVE mg/dL
Ketones, ur: 15 mg/dL — AB
Leukocytes,Ua: NEGATIVE
Nitrite: NEGATIVE
Protein, ur: 100 mg/dL — AB
Specific Gravity, Urine: 1.025 (ref 1.005–1.030)
pH: 7 (ref 5.0–8.0)

## 2023-12-24 LAB — BASIC METABOLIC PANEL WITH GFR
Anion gap: 11 (ref 5–15)
BUN: 23 mg/dL — ABNORMAL HIGH (ref 6–20)
CO2: 25 mmol/L (ref 22–32)
Calcium: 9.5 mg/dL (ref 8.9–10.3)
Chloride: 105 mmol/L (ref 98–111)
Creatinine, Ser: 1.13 mg/dL (ref 0.61–1.24)
GFR, Estimated: >60 mL/min (ref >60)
Glucose, Bld: 155 mg/dL — ABNORMAL HIGH (ref 70–99)
Potassium: 4 mmol/L (ref 3.5–5.1)
Sodium: 141 mmol/L (ref 135–145)

## 2023-12-24 LAB — CBC
HCT: 43.7 % (ref 39.0–52.0)
Hemoglobin: 14.8 g/dL (ref 13.0–17.0)
MCH: 29.4 pg (ref 26.0–34.0)
MCHC: 33.9 g/dL (ref 30.0–36.0)
MCV: 86.7 fL (ref 80.0–100.0)
Platelets: 248 K/uL (ref 150–400)
RBC: 5.04 MIL/uL (ref 4.22–5.81)
RDW: 12.7 % (ref 11.5–15.5)
WBC: 8.7 K/uL (ref 4.0–10.5)
nRBC: 0 % (ref 0.0–0.2)

## 2023-12-24 LAB — URINALYSIS, MICROSCOPIC (REFLEX)

## 2023-12-24 MED ORDER — HYDROMORPHONE HCL 1 MG/ML IJ SOLN
0.5000 mg | Freq: Once | INTRAMUSCULAR | Status: AC
  Administered 2023-12-24: 0.5 mg via INTRAVENOUS
  Filled 2023-12-24: qty 1

## 2023-12-24 MED ORDER — OXYCODONE-ACETAMINOPHEN 5-325 MG PO TABS: 1.0000 | ORAL_TABLET | Freq: Four times a day (QID) | ORAL | 0 refills | Status: AC | PRN

## 2023-12-24 MED ORDER — KETOROLAC TROMETHAMINE 15 MG/ML IJ SOLN
15.0000 mg | Freq: Once | INTRAMUSCULAR | Status: AC
  Administered 2023-12-24: 15 mg via INTRAVENOUS
  Filled 2023-12-24: qty 1

## 2023-12-24 MED ORDER — ONDANSETRON 8 MG PO TBDP: 8.0000 mg | ORAL_TABLET | Freq: Three times a day (TID) | ORAL | 0 refills | Status: AC | PRN

## 2023-12-24 MED ORDER — ONDANSETRON HCL 4 MG/2ML IJ SOLN
4.0000 mg | Freq: Once | INTRAMUSCULAR | Status: AC
  Administered 2023-12-24: 4 mg via INTRAVENOUS
  Filled 2023-12-24: qty 2

## 2023-12-24 NOTE — ED Triage Notes (Signed)
 Pt presents to ED with complaints of left flank pain. States that he thinks that he has a kidney stone. States that he has history of them. States that he did vomit x 1.

## 2023-12-24 NOTE — Telephone Encounter (Signed)
FYI. Pt going to ED.  

## 2023-12-24 NOTE — ED Provider Notes (Signed)
 [facility] Provider Note   CSN: [id number] Arrival date & time: 12/24/23  9173     Patient presents with: Flank Pain   Dennis Murray is a 58 y.o. male.   Pt c/o left flank pain acute onset this AM at rest, left flank posteriorly but now moving laterally/anteriorly. Feels similar to prior kidney stone pain. No hematuria or dysuria. No scrotal or testicular pain. Nausea and vomiting, not bloody or bilious. No fever or chills.   The history is provided by the patient, medical records and the spouse.  Flank Pain Pertinent negatives include no chest pain and no shortness of breath.       Prior to Admission medications   Medication Sig Start Date End Date Taking? Authorizing Provider  ondansetron  (ZOFRAN -ODT) 8 MG disintegrating tablet Take 1 tablet (8 mg total) by mouth every 8 (eight) hours as needed for nausea or vomiting. 12/24/23  Yes Bernard Drivers, MD  oxyCODONE -acetaminophen  (PERCOCET/ROXICET) 5-325 MG tablet Take 1 tablet by mouth every 6 (six) hours as needed for severe pain (pain score 7-10). 12/24/23  Yes Bernard Drivers, MD  cholecalciferol (VITAMIN D3) 25 MCG (1000 UNIT) tablet Take 1,000 Units by mouth daily. Patient not taking: Reported on 12/16/2023    [provider]  famotidine  (PEPCID ) 20 MG tablet TAKE 1 TABLET BY MOUTH DAILY Patient not taking: Reported on 12/16/2023 02/04/23   Saguier, [place], PA-C    Allergies: Patient has no known allergies.    Review of Systems  Constitutional:  Negative for fever.  Respiratory:  Negative for shortness of breath.   Cardiovascular:  Negative for chest pain.  Gastrointestinal:  Positive for nausea and vomiting.  Genitourinary:  Positive for flank pain. Negative for dysuria, hematuria, scrotal swelling and testicular pain.    Updated Vital Signs BP 102/76   Pulse 69   Temp 97.6 F (36.4 C) (Oral)   Resp 18   Ht 1.829 m (6')   Wt 88.5 kg   SpO2 97%   BMI 26.45 kg/m    Physical Exam Vitals and nursing note reviewed.  Constitutional:      Appearance: Normal appearance. He is well-developed.  HENT:     Head: Atraumatic.     Mouth/Throat:     Mouth: Mucous membranes are moist.  Eyes:     General: No scleral icterus.    Conjunctiva/sclera: Conjunctivae normal.  Neck:     Trachea: No tracheal deviation.  Cardiovascular:     Pulses: Normal pulses.  Pulmonary:     Effort: Pulmonary effort is normal. No accessory muscle usage or respiratory distress.     Breath sounds: Normal breath sounds.  Abdominal:     General: Bowel sounds are normal. There is no distension.     Palpations: Abdomen is soft. There is no mass.     Tenderness: There is no abdominal tenderness. There is no guarding.     Hernia: No hernia is present.  Genitourinary:    Comments: No cva tenderness. Normal external gu exam, no scrotal/testicular pain or swelling.  Musculoskeletal:        General: No swelling.     Cervical back: Neck supple.  Skin:    General: Skin is warm and dry.     Findings: No rash.  Neurological:     Mental Status: He is alert.     Comments: Alert, speech clear.   Psychiatric:        Mood and Affect: Mood normal.     (  all labs ordered are listed, but only abnormal results are displayed) Results for orders placed or performed during the hospital encounter of 12/24/23  Basic metabolic panel   Collection Time: 12/24/23  8:40 AM  Result Value Ref Range   Sodium 141 135 - 145 mmol/L   Potassium 4.0 3.5 - 5.1 mmol/L   Chloride 105 98 - 111 mmol/L   CO2 25 22 - 32 mmol/L   Glucose, Bld 155 (H) 70 - 99 mg/dL   BUN 23 (H) 6 - 20 mg/dL   Creatinine, Ser 8.86 0.61 - 1.24 mg/dL   Calcium 9.5 8.9 - 89.6 mg/dL   GFR, Estimated >39 >39 mL/min   Anion gap 11 5 - 15  CBC   Collection Time: 12/24/23  8:40 AM  Result Value Ref Range   WBC 8.7 4.0 - 10.5 K/uL   RBC 5.04 4.22 - 5.81 MIL/uL   Hemoglobin 14.8 13.0 - 17.0 g/dL   HCT 56.2 60.9 - 47.9 %   MCV 86.7  80.0 - 100.0 fL   MCH 29.4 26.0 - 34.0 pg   MCHC 33.9 30.0 - 36.0 g/dL   RDW 87.2 88.4 - 84.4 %   Platelets 248 150 - 400 K/uL   nRBC 0.0 0.0 - 0.2 %  Urinalysis, Routine w reflex microscopic -Urine, Clean Catch   Collection Time: 12/24/23 10:02 AM  Result Value Ref Range   Color, Urine YELLOW YELLOW   APPearance CLEAR CLEAR   Specific Gravity, Urine 1.025 1.005 - 1.030   pH 7.0 5.0 - 8.0   Glucose, UA NEGATIVE NEGATIVE mg/dL   Hgb urine dipstick LARGE (A) NEGATIVE   Bilirubin Urine NEGATIVE NEGATIVE   Ketones, ur 15 (A) NEGATIVE mg/dL   Protein, ur 899 (A) NEGATIVE mg/dL   Nitrite NEGATIVE NEGATIVE   Leukocytes,Ua NEGATIVE NEGATIVE  Urinalysis, Microscopic (reflex)   Collection Time: 12/24/23 10:02 AM  Result Value Ref Range   RBC / HPF 11-20 0 - 5 RBC/hpf   WBC, UA 0-5 0 - 5 WBC/hpf   Bacteria, UA FEW (A) NONE SEEN   Squamous Epithelial / HPF 0-5 0 - 5 /HPF   Mucus PRESENT      EKG: None  Radiology: CT Renal Stone Study Result Date: 12/24/2023 EXAM: CT ABDOMEN AND PELVIS WITHOUT CONTRAST 12/24/2023 09:36:00 AM TECHNIQUE: CT of the abdomen and pelvis was performed without the administration of intravenous contrast. Multiplanar reformatted images are provided for review. Automated exposure control, iterative reconstruction, and/or weight-based adjustment of the mA/kV was utilized to reduce the radiation dose to as low as reasonably achievable. COMPARISON: None available. CLINICAL HISTORY: Abdominal/flank pain, stone suspected; left flank pain. FINDINGS: LOWER CHEST: No acute abnormality. LIVER: The liver is unremarkable. GALLBLADDER AND BILE DUCTS: Gallbladder is unremarkable. No biliary ductal dilatation. SPLEEN: No acute abnormality. PANCREAS: No acute abnormality. ADRENAL GLANDS: No acute abnormality. KIDNEYS, URETERS AND BLADDER: There is a 3 mm calculus present at the left ureterovesical junction causing mild left hydroureteronephrosis. No stones in the right kidney or  ureter. No right hydronephrosis. No perinephric or periureteral stranding. Urinary bladder is unremarkable. GI AND BOWEL: Stomach demonstrates no acute abnormality. There are numerous colonic diverticula present. There is no evidence of diverticulitis. There is no bowel obstruction. PERITONEUM AND RETROPERITONEUM: No ascites. No free air. VASCULATURE: The abdominal aorta is normal in caliber and demonstrates mild calcific atheromatous disease. LYMPH NODES: No lymphadenopathy. REPRODUCTIVE ORGANS: No acute abnormality. BONES AND SOFT TISSUES: No acute osseous abnormality. No focal soft tissue  abnormality. IMPRESSION: 1. 3 mm calculus at the left ureterovesical junction causing mild left hydroureteronephrosis. 2. Numerous colonic diverticula without evidence of diverticulitis. 3. Abdominal aorta normal in caliber with mild calcific atherosclerosis. Electronically signed by: Evalene Coho MD 12/24/2023 10:09 AM EST RP Workstation: [id number]     Procedures   Medications Ordered in the ED  ketorolac  (TORADOL ) 15 MG/ML injection 15 mg (15 mg Intravenous Given 12/24/23 0852)  HYDROmorphone  (DILAUDID ) injection 0.5 mg (0.5 mg Intravenous Given 12/24/23 0852)  ondansetron  (ZOFRAN ) injection 4 mg (4 mg Intravenous Given 12/24/23 0852)                                    Medical Decision Making Problems Addressed: Kidney stone: acute illness or injury Left flank pain: acute illness or injury with systemic symptoms that poses a threat to life or bodily functions Ureteral colic: acute illness or injury with systemic symptoms  Amount and/or Complexity of Data Reviewed Independent Historian: spouse    Details: hx External Data Reviewed: notes. Labs: ordered. Decision-making details documented in ED Course. Radiology: ordered and independent interpretation performed. Decision-making details documented in ED Course.  Risk Prescription drug management. Parenteral controlled substances. Decision  regarding hospitalization.   Iv ns. Labs ordered/sent. Imaging ordered.   Differential diagnosis includes renal colic, flank pain, etc. Dispo decision including potential need for admission considered - will get labs and imaging and reassess.   Reviewed nursing notes and prior charts for additional history. External reports reviewed. Additional history from: spouse.  Dilaudid  iv, toradol  iv. Zofran  iv.   Labs reviewed/interpreted by me - wbc and hgb normal. Ua w few rbcs.   CT reviewed/interpreted by me -  left ureteral stone ~ 3 mm at uvj.   Recheck patient, no pain. Pt appears stable for ed d/c.   Rec close pcp/urology f/u.  Return precautions provided.       Final diagnoses:  Left flank pain  Ureteral colic  Kidney stone    ED Discharge Orders          Ordered    oxyCODONE -acetaminophen  (PERCOCET/ROXICET) 5-325 MG tablet  Every 6 hours PRN        12/24/23 1104    ondansetron  (ZOFRAN -ODT) 8 MG disintegrating tablet  Every 8 hours PRN        12/24/23 1104               Iyonna Rish, MD 12/24/23 1106

## 2023-12-24 NOTE — Telephone Encounter (Signed)
 FYI Only or Action Required?: FYI only for provider: ED advised.  Patient was last seen in primary care on 12/16/2023 by Dorina Loving, PA-C.  Called Nurse Triage reporting Flank Pain.  Symptoms began yesterday.  Interventions attempted: Nothing.  Symptoms are: gradually worsening.  Triage Disposition: Go to ED Now (Notify PCP)  Patient/caregiver understands and will follow disposition?: Yes Reason for Disposition  [1] Unable to urinate (or only a few drops) > 4 hours AND [2] bladder feels very full (e.g., palpable bladder or strong urge to urinate)  Answer Assessment - Initial Assessment Questions Patient's wife Macario calling in today. Denies fever, hematuria. Last voided a few drops 10 minutes ago. Last normal stream of urine was sometime yesterday. Last kidney stone was a few years ago. Advised ED. Wife verbalized understanding and stated she will get him there.   1. LOCATION: Where does it hurt? (e.g., left, right)     Left   2. ONSET: When did the pain start?     Yesterday lower back was sore and progressed worse throughout the night   3. SEVERITY: How bad is the pain? (e.g., Scale 1-10; mild, moderate, or severe)     Severe  4. PATTERN: Does the pain come and go, or is it constant?      Constant  5. CAUSE: What do you think is causing the pain?     Kidney stone  6. OTHER SYMPTOMS:  Do you have any other symptoms? (e.g., fever, abdomen pain, vomiting, leg weakness, burning with urination, blood in urine)     Vomiting, been nauseous for last few hours. Feeling like needing to void but unable to.  Protocols used: Flank Pain-A-AH  Copied from CRM [id number]. Topic: Clinical - Red Word Triage >> Dec 24, 2023  8:03 AM Robinson H wrote: [facility] that prompted transfer to Nurse Triage: Severe pain on left side, possible kidney stone, trying to urinate but can't, very nauseas

## 2023-12-24 NOTE — Discharge Instructions (Addendum)
 It was our pleasure to provide your ER care today - we hope that you feel better.  You CT scan shows a 3mm kidney stone in the ureter just above the bladder.   Drink plenty of fluids/stay well hydrated. Strain urine. Take motrin or aleve as need for pain. You may also take percocet as need for pain. No driving for the next 6 hours or when taking percocet. Also, do not take tylenol  or acetaminophen  containing medication when taking percocet. You may take zofran  as need for nausea.   Follow up with urologist in the coming week if symptoms fail to improve/resolve. Call office to arrange appointment.   Return to ER if worse, new symptoms, fevers, severe or intractable pain, persistent vomiting, or other concern.

## 2023-12-24 NOTE — ED Notes (Signed)
 Patient transported to CT
